# Patient Record
Sex: Female | Born: 1975 | Race: Black or African American | Hispanic: No | Marital: Single | Smoking: Never smoker
Health system: Southern US, Community
[De-identification: ages and names within clinical notes are randomized; demographics above are authoritative.]

## PROBLEM LIST (undated history)

## (undated) DIAGNOSIS — I10 Essential (primary) hypertension: Secondary | ICD-10-CM

## (undated) DIAGNOSIS — J45909 Unspecified asthma, uncomplicated: Secondary | ICD-10-CM

## (undated) DIAGNOSIS — J4489 Other specified chronic obstructive pulmonary disease: Secondary | ICD-10-CM

## (undated) DIAGNOSIS — I63511 Cerebral infarction due to unspecified occlusion or stenosis of right middle cerebral artery: Secondary | ICD-10-CM

## (undated) DIAGNOSIS — F329 Major depressive disorder, single episode, unspecified: Secondary | ICD-10-CM

## (undated) DIAGNOSIS — G40909 Epilepsy, unspecified, not intractable, without status epilepticus: Secondary | ICD-10-CM

## (undated) DIAGNOSIS — E559 Vitamin D deficiency, unspecified: Secondary | ICD-10-CM

## (undated) DIAGNOSIS — J9621 Acute and chronic respiratory failure with hypoxia: Secondary | ICD-10-CM

## (undated) DIAGNOSIS — N390 Urinary tract infection, site not specified: Secondary | ICD-10-CM

## (undated) DIAGNOSIS — J449 Chronic obstructive pulmonary disease, unspecified: Secondary | ICD-10-CM

## (undated) DIAGNOSIS — F32A Depression, unspecified: Secondary | ICD-10-CM

## (undated) DIAGNOSIS — R202 Paresthesia of skin: Secondary | ICD-10-CM

## (undated) DIAGNOSIS — Z930 Tracheostomy status: Secondary | ICD-10-CM

## (undated) DIAGNOSIS — R739 Hyperglycemia, unspecified: Secondary | ICD-10-CM

---

## 2014-05-13 ENCOUNTER — Encounter (HOSPITAL_COMMUNITY): Payer: Self-pay | Admitting: Emergency Medicine

## 2014-05-13 ENCOUNTER — Emergency Department (HOSPITAL_COMMUNITY)
Admission: EM | Admit: 2014-05-13 | Discharge: 2014-05-14 | Disposition: A | Discharge status: Home / Self Care | Payer: Medicare Other | Attending: Emergency Medicine | Admitting: Emergency Medicine

## 2014-05-13 ENCOUNTER — Emergency Department (HOSPITAL_COMMUNITY): Payer: Medicare Other

## 2014-05-13 DIAGNOSIS — R4182 Altered mental status, unspecified: Secondary | ICD-10-CM | POA: Insufficient documentation

## 2014-05-13 DIAGNOSIS — R443 Hallucinations, unspecified: Secondary | ICD-10-CM | POA: Diagnosis present

## 2014-05-13 DIAGNOSIS — N390 Urinary tract infection, site not specified: Secondary | ICD-10-CM | POA: Diagnosis present

## 2014-05-13 DIAGNOSIS — F22 Delusional disorders: Secondary | ICD-10-CM | POA: Diagnosis present

## 2014-05-13 DIAGNOSIS — F3289 Other specified depressive episodes: Secondary | ICD-10-CM | POA: Insufficient documentation

## 2014-05-13 DIAGNOSIS — F329 Major depressive disorder, single episode, unspecified: Secondary | ICD-10-CM | POA: Diagnosis not present

## 2014-05-13 DIAGNOSIS — R209 Unspecified disturbances of skin sensation: Secondary | ICD-10-CM | POA: Diagnosis not present

## 2014-05-13 DIAGNOSIS — J45909 Unspecified asthma, uncomplicated: Secondary | ICD-10-CM | POA: Insufficient documentation

## 2014-05-13 DIAGNOSIS — I10 Essential (primary) hypertension: Secondary | ICD-10-CM | POA: Diagnosis not present

## 2014-05-13 DIAGNOSIS — F29 Unspecified psychosis not due to a substance or known physiological condition: Secondary | ICD-10-CM | POA: Diagnosis present

## 2014-05-13 DIAGNOSIS — E559 Vitamin D deficiency, unspecified: Secondary | ICD-10-CM | POA: Diagnosis not present

## 2014-05-13 DIAGNOSIS — F99 Mental disorder, not otherwise specified: Secondary | ICD-10-CM

## 2014-05-13 DIAGNOSIS — R7309 Other abnormal glucose: Secondary | ICD-10-CM | POA: Insufficient documentation

## 2014-05-13 HISTORY — DX: Vitamin D deficiency, unspecified: E55.9

## 2014-05-13 HISTORY — DX: Major depressive disorder, single episode, unspecified: F32.9

## 2014-05-13 HISTORY — DX: Hyperglycemia, unspecified: R73.9

## 2014-05-13 HISTORY — DX: Unspecified asthma, uncomplicated: J45.909

## 2014-05-13 HISTORY — DX: Paresthesia of skin: R20.2

## 2014-05-13 HISTORY — DX: Depression, unspecified: F32.A

## 2014-05-13 HISTORY — DX: Essential (primary) hypertension: I10

## 2014-05-13 HISTORY — DX: Urinary tract infection, site not specified: N39.0

## 2014-05-13 LAB — URINE MICROSCOPIC-ADD ON

## 2014-05-13 LAB — CBC
HCT: 35.2 % — ABNORMAL LOW (ref 36.0–46.0)
Hemoglobin: 11.5 g/dL — ABNORMAL LOW (ref 12.0–15.0)
MCH: 23 pg — AB (ref 26.0–34.0)
MCHC: 32.7 g/dL (ref 30.0–36.0)
MCV: 70.3 fL — AB (ref 78.0–100.0)
PLATELETS: 311 10*3/uL (ref 150–400)
RBC: 5.01 MIL/uL (ref 3.87–5.11)
RDW: 15.3 % (ref 11.5–15.5)
WBC: 6.7 10*3/uL (ref 4.0–10.5)

## 2014-05-13 LAB — COMPREHENSIVE METABOLIC PANEL
ALBUMIN: 3.6 g/dL (ref 3.5–5.2)
ALT: 13 U/L (ref 0–35)
AST: 13 U/L (ref 0–37)
Alkaline Phosphatase: 129 U/L — ABNORMAL HIGH (ref 39–117)
BUN: 14 mg/dL (ref 6–23)
CALCIUM: 9.4 mg/dL (ref 8.4–10.5)
CHLORIDE: 105 meq/L (ref 96–112)
CO2: 24 meq/L (ref 19–32)
CREATININE: 0.99 mg/dL (ref 0.50–1.10)
GFR calc Af Amer: 83 mL/min — ABNORMAL LOW (ref >90)
GFR, EST NON AFRICAN AMERICAN: 72 mL/min — AB (ref >90)
Glucose, Bld: 78 mg/dL (ref 70–99)
Potassium: 4.3 mEq/L (ref 3.7–5.3)
SODIUM: 141 meq/L (ref 137–147)
Total Bilirubin: 0.6 mg/dL (ref 0.3–1.2)
Total Protein: 7.4 g/dL (ref 6.0–8.3)

## 2014-05-13 LAB — RAPID URINE DRUG SCREEN, HOSP PERFORMED
Amphetamines: NOT DETECTED
BARBITURATES: NOT DETECTED
Benzodiazepines: NOT DETECTED
Cocaine: NOT DETECTED
Opiates: NOT DETECTED
Tetrahydrocannabinol: NOT DETECTED

## 2014-05-13 LAB — URINALYSIS, ROUTINE W REFLEX MICROSCOPIC
BILIRUBIN URINE: NEGATIVE
Glucose, UA: NEGATIVE mg/dL
Hgb urine dipstick: NEGATIVE
Ketones, ur: NEGATIVE mg/dL
NITRITE: NEGATIVE
PROTEIN: NEGATIVE mg/dL
Specific Gravity, Urine: 1.019 (ref 1.005–1.030)
Urobilinogen, UA: 0.2 mg/dL (ref 0.0–1.0)
pH: 5 (ref 5.0–8.0)

## 2014-05-13 LAB — SALICYLATE LEVEL: Salicylate Lvl: <2 mg/dL — ABNORMAL LOW (ref 2.8–20.0)

## 2014-05-13 LAB — POC URINE PREG, ED: Preg Test, Ur: NEGATIVE

## 2014-05-13 LAB — ACETAMINOPHEN LEVEL: Acetaminophen (Tylenol), Serum: <15 ug/mL (ref 10–30)

## 2014-05-13 MED ORDER — ONDANSETRON HCL 4 MG PO TABS: 4.0000 mg | ORAL_TABLET | Freq: Three times a day (TID) | ORAL | Status: DC | PRN

## 2014-05-13 MED ORDER — ZOLPIDEM TARTRATE 5 MG PO TABS: 5.0000 mg | ORAL_TABLET | Freq: Every evening | ORAL | Status: DC | PRN

## 2014-05-13 MED ORDER — LORAZEPAM 1 MG PO TABS: 1.0000 mg | ORAL_TABLET | Freq: Three times a day (TID) | ORAL | Status: DC | PRN

## 2014-05-13 MED ORDER — CEPHALEXIN 250 MG PO CAPS
500.0000 mg | ORAL_CAPSULE | Freq: Once | ORAL | Status: AC
  Administered 2014-05-13: 500 mg via ORAL
  Filled 2014-05-13: qty 2

## 2014-05-13 MED ORDER — ALUM & MAG HYDROXIDE-SIMETH 200-200-20 MG/5ML PO SUSP: 30.0000 mL | ORAL | Status: DC | PRN

## 2014-05-13 MED ORDER — CEPHALEXIN 500 MG PO CAPS
500.0000 mg | ORAL_CAPSULE | Freq: Four times a day (QID) | ORAL | Status: DC
  Administered 2014-05-14 (×3): 500 mg via ORAL
  Filled 2014-05-13 (×3): qty 1

## 2014-05-13 MED ORDER — IBUPROFEN 200 MG PO TABS: 600.0000 mg | ORAL_TABLET | Freq: Three times a day (TID) | ORAL | Status: DC | PRN

## 2014-05-13 MED ORDER — NICOTINE 21 MG/24HR TD PT24: 21.0000 mg | MEDICATED_PATCH | Freq: Every day | TRANSDERMAL | Status: DC

## 2014-05-13 NOTE — ED Notes (Signed)
Pt caregiver, Blondene, called and updated.

## 2014-05-13 NOTE — ED Notes (Signed)
Pt lives in group home. Administrator brought her today for evaluation of change in mental status this week. She was at [facility] yesterday and diagnosed with UTI and started on first dose of oral abx last night. [facility] told the administrator to bring the pt here for further psychiatric care. Pt denies thoughts of hurting herself or others.

## 2014-05-13 NOTE — ED Notes (Signed)
Patient received a ham sandwich

## 2014-05-13 NOTE — ED Notes (Signed)
[phone] Dalene CarrowBlondene Dalton, pt caregiver at group home

## 2014-05-13 NOTE — ED Notes (Signed)
Pt placed into gown upon arrival to room. Pt placed on monitor. Pt monitored by 5 lead, pulse ox, an blood pressure.

## 2014-05-13 NOTE — ED Notes (Signed)
Patient was given a sprite. 

## 2014-05-13 NOTE — ED Notes (Signed)
Patient change into paper scrubs assisted by Rodney LangtonMIchelle, Coker, MHT. Security called to wand patient.

## 2014-05-13 NOTE — ED Provider Notes (Signed)
Medical screening examination/treatment/procedure(s) were conducted as a shared visit with non-physician practitioner(s) and myself.  I personally evaluated the patient during the encounter.  Pt medically stable.  [facility] at [facility].    Linwood DibblesJon Knapp, MD 05/13/14 [phone]

## 2014-05-13 NOTE — ED Notes (Signed)
Pt took the heart monitor off, got dressed and tried to walk out of ED, pt states "You told me I was ready to go, I have to go see my family in [place]." This RN guided pt back to room and informed pt that we were [facility] done. Pt offered something to eat and drink per [facility] approval. nad noted. Pt placed back on heart monitor, currently calm.

## 2014-05-13 NOTE — BH Assessment (Signed)
[facility] Assessment Progress Note  At 15:53 I spoke to EDP Johnnette Gourdobyn Albert, PA-C in anticipation of TTS assessment.  Doylene Canninghomas Hughes, MA Triage Specialist 05/13/2014 @ 15:55

## 2014-05-13 NOTE — ED Notes (Signed)
Patient arrive to unit via ambulatory escorted by [facility] transportation staff fully dressed. Writer explain to patient that she would have to change into paper scrubs patient verbalized understanding.
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
## 2014-05-13 NOTE — ED Notes (Signed)
Contacted BH for update on pt TTS.

## 2014-05-13 NOTE — ED Notes (Signed)
Blondene contacted, states that she [place]. [facility].

## 2014-05-13 NOTE — ED Notes (Signed)
telepsych machine set up at bedside. BH to call in 5 minutes

## 2014-05-14 ENCOUNTER — Encounter (HOSPITAL_COMMUNITY): Payer: Self-pay | Admitting: Psychiatry

## 2014-05-14 DIAGNOSIS — N390 Urinary tract infection, site not specified: Secondary | ICD-10-CM | POA: Diagnosis not present

## 2014-05-14 DIAGNOSIS — F22 Delusional disorders: Secondary | ICD-10-CM | POA: Diagnosis present

## 2014-05-14 DIAGNOSIS — R443 Hallucinations, unspecified: Secondary | ICD-10-CM | POA: Diagnosis present

## 2014-05-14 DIAGNOSIS — F29 Unspecified psychosis not due to a substance or known physiological condition: Secondary | ICD-10-CM | POA: Diagnosis present

## 2014-05-14 LAB — URINE CULTURE
COLONY COUNT: NO GROWTH
CULTURE: NO GROWTH

## 2014-05-14 MED ORDER — ARIPIPRAZOLE 10 MG PO TABS
10.0000 mg | ORAL_TABLET | Freq: Every day | ORAL | Status: DC
  Administered 2014-05-14: 10 mg via ORAL
  Filled 2014-05-14: qty 1

## 2014-05-14 MED ORDER — LISINOPRIL 10 MG PO TABS
10.0000 mg | ORAL_TABLET | Freq: Every day | ORAL | Status: DC
  Administered 2014-05-14: 10 mg via ORAL
  Filled 2014-05-14: qty 1

## 2014-05-14 MED ORDER — CEPHALEXIN 500 MG PO CAPS: 500.0000 mg | ORAL_CAPSULE | Freq: Four times a day (QID) | ORAL | Status: AC

## 2014-05-14 MED ORDER — DULOXETINE HCL 30 MG PO CPEP
30.0000 mg | ORAL_CAPSULE | Freq: Every day | ORAL | Status: DC
  Administered 2014-05-14: 30 mg via ORAL
  Filled 2014-05-14: qty 1
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
## 2014-05-15 NOTE — Consult Note (Signed)
Face to face evaluation and I agree with this note 

## 2014-08-19 ENCOUNTER — Ambulatory Visit (INDEPENDENT_AMBULATORY_CARE_PROVIDER_SITE_OTHER): Payer: Medicare Other | Admitting: Podiatrist

## 2014-08-19 ENCOUNTER — Encounter: Payer: Self-pay | Admitting: Podiatrist

## 2014-08-19 VITALS — BP 124/76 | HR 86 | Resp 12

## 2014-08-19 DIAGNOSIS — M79609 Pain in unspecified limb: Secondary | ICD-10-CM

## 2014-08-19 DIAGNOSIS — B351 Tinea unguium: Secondary | ICD-10-CM | POA: Diagnosis not present

## 2014-08-19 DIAGNOSIS — L84 Corns and callosities: Secondary | ICD-10-CM | POA: Diagnosis not present

## 2014-08-19 DIAGNOSIS — M79676 Pain in unspecified toe(s): Principal | ICD-10-CM

## 2014-08-19 DIAGNOSIS — M216X9 Other acquired deformities of unspecified foot: Secondary | ICD-10-CM | POA: Diagnosis not present

## 2014-08-19 NOTE — Progress Notes (Signed)
  Chief Complaint  Patient presents with  . Nail Problem    ''TOENAILS TRIM AND CHECK DIABETIC FEET.''     HPI: Patient is 38 y.o. female who presents today for thick and elongated toenails. She is not diabetic and has no complaints of numbness or tingling in the feet.  She is unable to trim her toenails herself.     Review of Systems  DATA OBTAINED: from patient's caregiver GENERAL: Feels well no fevers, no fatigue, no changes in appetite SKIN: No itching, no rashes, no open wounds EYES: No eye pain,no redness, no discharge EARS: No earache,no ringing of ears, NOSE: No congestion, no drainage, no bleeding  MOUTH/THROAT: No mouth pain, No sore throat, No difficulty chewing or swallowing  RESPIRATORY: No cough, no wheezing, no SOB CARDIAC: No chest pain,no heart palpitations, GI: No abdominal pain, No Nausea, no vomiting, no diarrhea, no heartburn or no reflux  GU: No dysuria, no increased frequency or urgency MUSCULOSKELETAL: No unrelieved bone/joint pain,  NEUROLOGIC: Awake, alert, appropriate to situation, No change in mental status. PSYCHIATRIC: No overt anxiety or sadness.No behavior issue.      Physical Exam  GENERAL APPEARANCE: Alert, conversant. Appropriately groomed. No acute distress.  VASCULAR: Pedal pulses palpable at 2/4 DP and PT bilateral.  Capillary refill time is immediate to all digits,  Proximal to distal cooling it warm to warm.  Digital hair growth is present bilateral  NEUROLOGIC: sensation is intact epicritically and protectively to 5.07 monofilament at 5/5 sites bilateral.  Light touch is intact bilateral, vibratory sensation intact bilateral, achilles tendon reflex is intact bilateral.  MUSCULOSKELETAL: acceptable muscle strength, tone and stability bilateral.  Intrinsic muscluature intact bilateral.  Rectus appearance of foot and digits noted bilateral.   DERMATOLOGIC: skin color, texture, and turger are within normal limits.  No preulcerative lesions are  seen, no interdigital maceration noted.  No open lesions present.  Digital nails are thick, discolored, distrophic, elongated and fungal    Patient Active Problem List   Diagnosis Date Noted  . Paranoia 05/14/2014  . Hallucinations 05/14/2014  . Psychosis 05/14/2014    Assessment   Symptomatic mycotic toenails  Plan:  Debridement of toenails carried out without complication.   She will be seen back in 3 months or as needed for continued care.

## 2014-11-27 ENCOUNTER — Ambulatory Visit: Payer: Medicaid Other

## 2015-08-12 ENCOUNTER — Ambulatory Visit: Payer: Medicare Other | Admitting: Podiatry

## 2018-10-26 ENCOUNTER — Inpatient Hospital Stay
Admission: RE | Admit: 2018-10-26 | Discharge: 2018-11-23 | Disposition: A | Discharge status: Other Acute Inpatient Hospital | Payer: Medicare Other | Source: Other Acute Inpatient Hospital | Attending: Internal Medicine | Admitting: Internal Medicine

## 2018-10-26 ENCOUNTER — Institutional Professional Consult (permissible substitution) (HOSPITAL_COMMUNITY): Payer: Medicare Other

## 2018-10-26 DIAGNOSIS — G40909 Epilepsy, unspecified, not intractable, without status epilepticus: Secondary | ICD-10-CM

## 2018-10-26 DIAGNOSIS — I63511 Cerebral infarction due to unspecified occlusion or stenosis of right middle cerebral artery: Secondary | ICD-10-CM | POA: Diagnosis present

## 2018-10-26 DIAGNOSIS — Z931 Gastrostomy status: Secondary | ICD-10-CM

## 2018-10-26 DIAGNOSIS — Z930 Tracheostomy status: Secondary | ICD-10-CM

## 2018-10-26 DIAGNOSIS — J449 Chronic obstructive pulmonary disease, unspecified: Secondary | ICD-10-CM | POA: Diagnosis present

## 2018-10-26 DIAGNOSIS — J9621 Acute and chronic respiratory failure with hypoxia: Secondary | ICD-10-CM | POA: Diagnosis present

## 2018-10-26 DIAGNOSIS — J189 Pneumonia, unspecified organism: Secondary | ICD-10-CM

## 2018-10-26 HISTORY — DX: Chronic obstructive pulmonary disease, unspecified: J44.9

## 2018-10-26 HISTORY — DX: Tracheostomy status: Z93.0

## 2018-10-26 HISTORY — DX: Epilepsy, unspecified, not intractable, without status epilepticus: G40.909

## 2018-10-26 HISTORY — DX: Other specified chronic obstructive pulmonary disease: J44.89

## 2018-10-26 HISTORY — DX: Cerebral infarction due to unspecified occlusion or stenosis of right middle cerebral artery: I63.511

## 2018-10-26 HISTORY — DX: Acute and chronic respiratory failure with hypoxia: J96.21

## 2018-10-26 LAB — BLOOD GAS, ARTERIAL
Acid-Base Excess: 2.3 mmol/L — ABNORMAL HIGH (ref 0.0–2.0)
Bicarbonate: 26.4 mmol/L (ref 20.0–28.0)
FIO2: 0.3
LHR: 14 {breaths}/min
O2 SAT: 96.8 %
PATIENT TEMPERATURE: 98.6
PCO2 ART: 41.3 mmHg (ref 32.0–48.0)
PEEP: 30 cmH2O
PH ART: 7.421 (ref 7.350–7.450)
PIP: 16 cmH2O
PO2 ART: 86.7 mmHg (ref 83.0–108.0)

## 2018-10-26 MED ORDER — IOPAMIDOL (ISOVUE-300) INJECTION 61%
50.0000 mL | Freq: Once | INTRAVENOUS | Status: AC | PRN
  Administered 2018-11-18: 75 mL

## 2018-10-26 MED ORDER — IOPAMIDOL (ISOVUE-300) INJECTION 61%
INTRAVENOUS | Status: AC
  Administered 2018-10-26: 50 mL via GASTROSTOMY
  Filled 2018-10-26: qty 50

## 2018-10-27 DIAGNOSIS — J449 Chronic obstructive pulmonary disease, unspecified: Secondary | ICD-10-CM | POA: Diagnosis not present

## 2018-10-27 DIAGNOSIS — I63511 Cerebral infarction due to unspecified occlusion or stenosis of right middle cerebral artery: Secondary | ICD-10-CM | POA: Diagnosis not present

## 2018-10-27 DIAGNOSIS — J9621 Acute and chronic respiratory failure with hypoxia: Secondary | ICD-10-CM

## 2018-10-27 DIAGNOSIS — Z930 Tracheostomy status: Secondary | ICD-10-CM

## 2018-10-27 DIAGNOSIS — G40909 Epilepsy, unspecified, not intractable, without status epilepticus: Secondary | ICD-10-CM

## 2018-10-27 LAB — CBC
HEMATOCRIT: 32.4 % — AB (ref 36.0–46.0)
HEMOGLOBIN: 10 g/dL — AB (ref 12.0–15.0)
MCH: 23.1 pg — AB (ref 26.0–34.0)
MCHC: 30.9 g/dL (ref 30.0–36.0)
MCV: 74.8 fL — ABNORMAL LOW (ref 80.0–100.0)
Platelets: 262 10*3/uL (ref 150–400)
RBC: 4.33 MIL/uL (ref 3.87–5.11)
RDW: 15.4 % (ref 11.5–15.5)
WBC: 10 10*3/uL (ref 4.0–10.5)
nRBC: 0.2 % (ref 0.0–0.2)

## 2018-10-27 LAB — COMPREHENSIVE METABOLIC PANEL
ALBUMIN: 2.4 g/dL — AB (ref 3.5–5.0)
ALT: 19 U/L (ref 0–44)
AST: 27 U/L (ref 15–41)
Alkaline Phosphatase: 96 U/L (ref 38–126)
Anion gap: 13 (ref 5–15)
BUN: 18 mg/dL (ref 6–20)
CHLORIDE: 108 mmol/L (ref 98–111)
CO2: 24 mmol/L (ref 22–32)
Calcium: 9 mg/dL (ref 8.9–10.3)
Creatinine, Ser: 0.97 mg/dL (ref 0.44–1.00)
GFR calc Af Amer: >60 mL/min (ref >60)
GFR calc non Af Amer: >60 mL/min (ref >60)
Glucose, Bld: 145 mg/dL — ABNORMAL HIGH (ref 70–99)
POTASSIUM: 3.8 mmol/L (ref 3.5–5.1)
SODIUM: 145 mmol/L (ref 135–145)
Total Bilirubin: 0.4 mg/dL (ref 0.3–1.2)
Total Protein: 6.7 g/dL (ref 6.5–8.1)

## 2018-10-27 LAB — C DIFFICILE QUICK SCREEN W PCR REFLEX
C Diff antigen: NEGATIVE
C Diff interpretation: NOT DETECTED
C Diff toxin: NEGATIVE

## 2018-10-27 NOTE — Consult Note (Signed)
[facility]  Date of Service: 10/27/2018  PULMONARY CRITICAL CARE CONSULT   Vanessa HamperChrystal West  MVH:[id number]:[id number]  DOB: [birth year]   DOA: 10/26/2018  Referring Physician: Carron CurieAli Hijazi, MD  HPI: Vanessa HamperChrystal West is a 42 y.o. female seen for follow up of Acute on Chronic Respiratory Failure.  Patient apparently had been transferred to the acute care facility with history of seizures.  Apparently had not been taking her medications for approximately 1 week.  CT scan was done for evaluation showed a middle cerebral artery stroke pattern with cerebral edema and midline shift.  Patient at that time was admitted to the ICU to be intubated because of respiratory airway protection.  She is currently on the ventilator at this time.  Hospital course basically she was not able to come off of the ventilator.  She required prolonged mechanical ventilation and for this reason patient underwent a tracheostomy.  She had been found to have an acute stroke with edema compression of her brain.  Review of Systems:  ROS performed and is unremarkable other than noted above.  Past Medical History:  Diagnosis Date  . Asthma   . Depression   . Hyperglycemia   . Hypertension   . Paresthesia   . Urinary tract infection 05/13/2014  . Vitamin D deficiency     No past surgical history on file.  Social History:    reports that she has never smoked. She has never used smokeless tobacco. She reports that she does not drink alcohol or use drugs.  Family History: Non-Contributory to the present illness  Allergies  Allergen Reactions  . Wellbutrin [Bupropion]     Medications: Reviewed on Rounds  Physical Exam:  Vitals: Temperature 97.6 pulse 88 respiratory rate 17 blood pressure 166/87 saturations 96%  Ventilator Settings mode of ventilation assist control FiO2 30% PEEP 5  . General: Comfortable at this time . Eyes: Grossly normal lids,  irises & conjunctiva . ENT: grossly tongue is normal . Neck: no obvious mass . Cardiovascular: S1-S2 normal no gallop or rub . Respiratory: Coarse rhonchi expansion equal . Abdomen: Obese soft nontender . Skin: no rash seen on limited exam . Musculoskeletal: not rigid . Psychiatric:unable to assess . Neurologic: no seizure no involuntary movements         Labs on Admission:  Basic Metabolic Panel: Recent Labs  Lab 10/27/18 0538  NA 145  K 3.8  CL 108  CO2 24  GLUCOSE 145*  BUN 18  CREATININE 0.97  CALCIUM 9.0    Recent Labs  Lab 10/26/18 2205  PHART 7.421  PCO2ART 41.3  PO2ART 86.7  HCO3 26.4  O2SAT 96.8    Liver Function Tests: Recent Labs  Lab 10/27/18 0538  AST 27  ALT 19  ALKPHOS 96  BILITOT 0.4  PROT 6.7  ALBUMIN 2.4*   No results for input(s): LIPASE, AMYLASE in the last 168 hours. No results for input(s): AMMONIA in the last 168 hours.  CBC: Recent Labs  Lab 10/27/18 0538  WBC 10.0  HGB 10.0*  HCT 32.4*  MCV 74.8*  PLT 262    Cardiac Enzymes: No results for input(s): CKTOTAL, CKMB, CKMBINDEX, TROPONINI in the last 168 hours.  BNP (last 3 results) No results for input(s): BNP in the last 8760 hours.  ProBNP (last 3 results) No results for input(s): PROBNP in the last 8760 hours.   Radiological Exams on Admission: Dg Abdomen Peg Tube Location  Result Date: 10/26/2018  CLINICAL DATA:  Peg tube status EXAM: ABDOMEN - 1 VIEW COMPARISON:  CT 04/25/2015 FINDINGS: Hand injection of contrast through the percutaneous gastrostomy tube demonstrates contrast filling the gastric fundus and gastric body. Retention bulb appears to be towards the gastric antrum. IMPRESSION: Contrast fills the stomach consistent with appropriate positioning of the percutaneous gastrostomy tube. Electronically Signed   By: Genevive Bi M.D.   On: 10/26/2018 23:53    Assessment/Plan Active Problems:   Acute on chronic respiratory failure with hypoxia (HCC)    Acute right arterial ischemic stroke, middle cerebral artery (MCA) (HCC)   Seizure disorder (HCC)   Chronic obstructive asthma without status asthmaticus (HCC)   Tracheostomy status (HCC)   1. Acute on chronic respiratory failure with hypoxia Case discussed on rounds with respiratory therapy.  We will continue with full support at this time titrate oxygen continue pulmonary toilet secretion management.  No chest x-ray was done will need 2. Acute stroke involving the middle cerebral artery territory.  We will continue with supportive care therapy she right now is nonverbal. 3. Seizure disorder she had an EEG done which had shown abnormal EEG with periodic discharges on the right and slowing of the background activity.  We will continue to monitor closely. 4. Chronic obstructive asthma at this time she is controlled we will use nebulizers as needed 5. Tracheostomy will continue with supportive care  I have personally seen and evaluated the patient, evaluated laboratory and imaging results, formulated the assessment and plan and placed orders. The Patient requires high complexity decision making for assessment and support.  Case was discussed on Rounds with the Respiratory Therapy Staff Time Spent  Yevonne Pax, MD [facility] Pulmonary Critical Care Medicine Sleep Medicine

## 2018-10-28 ENCOUNTER — Other Ambulatory Visit (HOSPITAL_COMMUNITY): Payer: Medicare Other

## 2018-10-28 ENCOUNTER — Encounter: Payer: Self-pay | Admitting: Internal Medicine

## 2018-10-28 DIAGNOSIS — J9621 Acute and chronic respiratory failure with hypoxia: Secondary | ICD-10-CM | POA: Diagnosis present

## 2018-10-28 DIAGNOSIS — J181 Lobar pneumonia, unspecified organism: Secondary | ICD-10-CM | POA: Diagnosis not present

## 2018-10-28 DIAGNOSIS — J449 Chronic obstructive pulmonary disease, unspecified: Secondary | ICD-10-CM | POA: Diagnosis not present

## 2018-10-28 DIAGNOSIS — Z930 Tracheostomy status: Secondary | ICD-10-CM

## 2018-10-28 DIAGNOSIS — I63511 Cerebral infarction due to unspecified occlusion or stenosis of right middle cerebral artery: Secondary | ICD-10-CM | POA: Diagnosis present

## 2018-10-28 DIAGNOSIS — G40909 Epilepsy, unspecified, not intractable, without status epilepticus: Secondary | ICD-10-CM

## 2018-10-28 LAB — URINALYSIS, ROUTINE W REFLEX MICROSCOPIC
Bacteria, UA: NONE SEEN
Bilirubin Urine: NEGATIVE
Glucose, UA: NEGATIVE mg/dL
Ketones, ur: NEGATIVE mg/dL
Nitrite: NEGATIVE
Protein, ur: NEGATIVE mg/dL
RBC / HPF: >50 RBC/hpf — ABNORMAL HIGH (ref 0–5)
Specific Gravity, Urine: 1.021 (ref 1.005–1.030)
pH: 5 (ref 5.0–8.0)

## 2018-10-28 MED ORDER — HYDRALAZINE HCL 20 MG/ML IJ SOLN
10.00 | INTRAMUSCULAR | Status: DC
Start: ? — End: 2018-10-28

## 2018-10-28 MED ORDER — LABETALOL HCL 5 MG/ML IV SOLN
10.00 | INTRAVENOUS | Status: DC
Start: ? — End: 2018-10-28

## 2018-10-28 MED ORDER — HYDROCORTISONE NA SUCCINATE PF 100 MG IJ SOLR
25.00 | INTRAMUSCULAR | Status: DC
Start: 2018-10-27 — End: 2018-10-28

## 2018-10-28 MED ORDER — SODIUM CHLORIDE 0.9 % IV SOLN
10.00 | INTRAVENOUS | Status: DC
Start: ? — End: 2018-10-28

## 2018-10-28 MED ORDER — LATANOPROST 0.005 % OP SOLN
1.00 | OPHTHALMIC | Status: DC
Start: 2018-10-26 — End: 2018-10-28

## 2018-10-28 MED ORDER — BISACODYL 10 MG RE SUPP
10.00 | RECTAL | Status: DC
Start: ? — End: 2018-10-28

## 2018-10-28 MED ORDER — CHLORHEXIDINE GLUCONATE 0.12 % MT SOLN
15.00 | OROMUCOSAL | Status: DC
Start: 2018-10-26 — End: 2018-10-28

## 2018-10-28 MED ORDER — ACETAMINOPHEN 325 MG PO TABS
650.00 | ORAL_TABLET | ORAL | Status: DC
Start: ? — End: 2018-10-28

## 2018-10-28 MED ORDER — LAMOTRIGINE 100 MG PO TABS
100.00 | ORAL_TABLET | ORAL | Status: DC
Start: 2018-10-26 — End: 2018-10-28

## 2018-10-28 MED ORDER — POTASSIUM CHLORIDE 20 MEQ/15ML (10%) PO SOLN
20.00 | ORAL | Status: DC
Start: ? — End: 2018-10-28

## 2018-10-28 MED ORDER — GENERIC EXTERNAL MEDICATION
3.38 | Status: DC
Start: 2018-10-27 — End: 2018-10-28

## 2018-10-28 MED ORDER — ASPIRIN 325 MG PO TABS
325.00 | ORAL_TABLET | ORAL | Status: DC
Start: 2018-10-27 — End: 2018-10-28

## 2018-10-28 MED ORDER — OXYCODONE HCL 5 MG PO TABS
5.00 | ORAL_TABLET | ORAL | Status: DC
Start: ? — End: 2018-10-28

## 2018-10-28 MED ORDER — DORZOLAMIDE HCL-TIMOLOL MAL 22.3-6.8 MG/ML OP SOLN
1.00 | OPHTHALMIC | Status: DC
Start: 2018-10-26 — End: 2018-10-28

## 2018-10-28 MED ORDER — DOCUSATE SODIUM 150 MG/15ML PO LIQD
100.00 | ORAL | Status: DC
Start: 2018-10-26 — End: 2018-10-28

## 2018-10-28 MED ORDER — LEVETIRACETAM 500 MG PO TABS
1500.00 | ORAL_TABLET | ORAL | Status: DC
Start: 2018-10-26 — End: 2018-10-28

## 2018-10-28 MED ORDER — GENERIC EXTERNAL MEDICATION
10.00 | Status: DC
Start: ? — End: 2018-10-28

## 2018-10-28 MED ORDER — ALBUTEROL SULFATE 0.63 MG/3ML IN NEBU
1.00 | INHALATION_SOLUTION | RESPIRATORY_TRACT | Status: DC
Start: ? — End: 2018-10-28

## 2018-10-28 MED ORDER — SENNOSIDES 8.8 MG/5ML PO SYRP
5.00 | ORAL_SOLUTION | ORAL | Status: DC
Start: 2018-10-26 — End: 2018-10-28

## 2018-10-28 MED ORDER — VALPROIC ACID 250 MG/5ML PO SOLN
750.00 | ORAL | Status: DC
Start: 2018-10-27 — End: 2018-10-28

## 2018-10-28 MED ORDER — HEPARIN SODIUM (PORCINE) 5000 UNIT/ML IJ SOLN
5000.00 | INTRAMUSCULAR | Status: DC
Start: 2018-10-26 — End: 2018-10-28

## 2018-10-28 MED ORDER — ATORVASTATIN CALCIUM 40 MG PO TABS
40.00 | ORAL_TABLET | ORAL | Status: DC
Start: 2018-10-26 — End: 2018-10-28

## 2018-10-28 MED ORDER — PURALUBE 85-15 % OP OINT
TOPICAL_OINTMENT | OPHTHALMIC | Status: DC
Start: 2018-10-26 — End: 2018-10-28

## 2018-10-28 MED ORDER — POTASSIUM CHLORIDE CRYS ER 20 MEQ PO TBCR
20.00 | EXTENDED_RELEASE_TABLET | ORAL | Status: DC
Start: ? — End: 2018-10-28

## 2018-10-28 MED ORDER — POTASSIUM CHLORIDE 20 MEQ/50ML IV SOLN
20.00 | INTRAVENOUS | Status: DC
Start: ? — End: 2018-10-28

## 2018-10-28 MED ORDER — ONDANSETRON HCL 4 MG/2ML IJ SOLN
4.00 | INTRAMUSCULAR | Status: DC
Start: ? — End: 2018-10-28

## 2018-10-28 MED ORDER — LACOSAMIDE 200 MG PO TABS
200.00 | ORAL_TABLET | ORAL | Status: DC
Start: 2018-10-26 — End: 2018-10-28

## 2018-10-28 NOTE — Progress Notes (Signed)
[facility]   PULMONARY CRITICAL CARE SERVICE  PROGRESS NOTE  Date of Service: 10/28/2018  Vanessa West  YQM:[id number]  DOB: [birth year]   DOA: 10/26/2018  Referring Physician: Carron CurieAli Hijazi, MD  HPI: Vanessa HamperChrystal Borg is a 42 y.o. female seen for follow up of Acute on Chronic Respiratory Failure.  Patient remains on full support at this time.  She did manage 3 hours yesterday on pressure support 12 PEEP of 5.  Early this morning the patient had a low-grade temp and became tachycardic in the 120s and 130s.  Will get chest x-ray at this time.  Medications: Reviewed on Rounds  Physical Exam:  Vitals: Pulse 107 respirations 18 blood pressure 178/92 O2 saturation 97% temp 99.1.  Ventilator Settings ventilator mode AC PC FiO2 30% tidal volume 492 PEEP of 5.  . General: Comfortable at this time . Eyes: Grossly normal lids, irises & conjunctiva . ENT: grossly tongue is normal . Neck: no obvious mass . Cardiovascular: S1 S2 normal no gallop . Respiratory: Coarse breath sounds bilaterally . Abdomen: soft . Skin: no rash seen on limited exam . Musculoskeletal: not rigid . Psychiatric:unable to assess . Neurologic: no seizure no involuntary movements         Lab Data:   Basic Metabolic Panel: Recent Labs  Lab 10/27/18 0538  NA 145  K 3.8  CL 108  CO2 24  GLUCOSE 145*  BUN 18  CREATININE 0.97  CALCIUM 9.0    ABG: Recent Labs  Lab 10/26/18 2205  PHART 7.421  PCO2ART 41.3  PO2ART 86.7  HCO3 26.4  O2SAT 96.8    Liver Function Tests: Recent Labs  Lab 10/27/18 0538  AST 27  ALT 19  ALKPHOS 96  BILITOT 0.4  PROT 6.7  ALBUMIN 2.4*   No results for input(s): LIPASE, AMYLASE in the last 168 hours. No results for input(s): AMMONIA in the last 168 hours.  CBC: Recent Labs  Lab 10/27/18 0538  WBC 10.0  HGB 10.0*  HCT 32.4*  MCV 74.8*  PLT 262    Cardiac Enzymes: No results for input(s): CKTOTAL, CKMB,  CKMBINDEX, TROPONINI in the last 168 hours.  BNP (last 3 results) No results for input(s): BNP in the last 8760 hours.  ProBNP (last 3 results) No results for input(s): PROBNP in the last 8760 hours.  Radiological Exams: Dg Abdomen Peg Tube Location  Result Date: 10/26/2018 CLINICAL DATA:  Peg tube status EXAM: ABDOMEN - 1 VIEW COMPARISON:  CT 04/25/2015 FINDINGS: Hand injection of contrast through the percutaneous gastrostomy tube demonstrates contrast filling the gastric fundus and gastric body. Retention bulb appears to be towards the gastric antrum. IMPRESSION: Contrast fills the stomach consistent with appropriate positioning of the percutaneous gastrostomy tube. Electronically Signed   By: Genevive BiStewart  Edmunds M.D.   On: 10/26/2018 23:53    Assessment/Plan Active Problems:   Acute on chronic respiratory failure with hypoxia (HCC)   Acute right arterial ischemic stroke, middle cerebral artery (MCA) (HCC)   Seizure disorder (HCC)   Chronic obstructive asthma without status asthmaticus (HCC)   Tracheostomy status (HCC)   1. Acute on chronic respiratory failure with hypoxia due to patient's episodes of fever and tachycardia will obtain chest x-ray at this time.  Consider antibiotic initiation based on chest x-ray.  Also patient has significant atelectasis needs aggressive pulmonary toilet. 2. Acute right middle cerebral artery stroke.  We will continue with supportive care she remains nonverbal nonresponsive. 3. Seizure disorder no  active seizures are visualized at this time.  EEG results of already been reviewed from previous admission. 4. Chronic obstructive asthma right now is stable we will continue with supportive care. 5. Tracheostomy remains in place continue aggressive pulmonary toilet. 6. Atelectasis lower lobes possible pneumonitis will discuss with the primary care team   I have personally seen and evaluated the patient, evaluated laboratory and imaging results, formulated the  assessment and plan and placed orders.  Time 35 minutes review of chart discussion with primary case care team The Patient requires high complexity decision making for assessment and support.  Case was discussed on Rounds with the Respiratory Therapy Staff  Yevonne Pax, MD [facility] Pulmonary Critical Care Medicine Sleep Medicine

## 2018-10-29 DIAGNOSIS — J9621 Acute and chronic respiratory failure with hypoxia: Secondary | ICD-10-CM | POA: Diagnosis not present

## 2018-10-29 DIAGNOSIS — G40909 Epilepsy, unspecified, not intractable, without status epilepticus: Secondary | ICD-10-CM | POA: Diagnosis not present

## 2018-10-29 DIAGNOSIS — I63511 Cerebral infarction due to unspecified occlusion or stenosis of right middle cerebral artery: Secondary | ICD-10-CM | POA: Diagnosis not present

## 2018-10-29 DIAGNOSIS — J449 Chronic obstructive pulmonary disease, unspecified: Secondary | ICD-10-CM | POA: Diagnosis not present

## 2018-10-29 LAB — CBC
HEMATOCRIT: 31.7 % — AB (ref 36.0–46.0)
Hemoglobin: 9.3 g/dL — ABNORMAL LOW (ref 12.0–15.0)
MCH: 22.4 pg — ABNORMAL LOW (ref 26.0–34.0)
MCHC: 29.3 g/dL — ABNORMAL LOW (ref 30.0–36.0)
MCV: 76.2 fL — ABNORMAL LOW (ref 80.0–100.0)
NRBC: 0.3 % — AB (ref 0.0–0.2)
Platelets: 292 10*3/uL (ref 150–400)
RBC: 4.16 MIL/uL (ref 3.87–5.11)
RDW: 15.8 % — ABNORMAL HIGH (ref 11.5–15.5)
WBC: 11.7 10*3/uL — ABNORMAL HIGH (ref 4.0–10.5)

## 2018-10-29 LAB — URINE CULTURE: Culture: NO GROWTH

## 2018-10-29 LAB — BASIC METABOLIC PANEL
Anion gap: 11 (ref 5–15)
BUN: 17 mg/dL (ref 6–20)
CHLORIDE: 106 mmol/L (ref 98–111)
CO2: 29 mmol/L (ref 22–32)
Calcium: 8.9 mg/dL (ref 8.9–10.3)
Creatinine, Ser: 0.87 mg/dL (ref 0.44–1.00)
GFR calc Af Amer: >60 mL/min (ref >60)
GFR calc non Af Amer: >60 mL/min (ref >60)
Glucose, Bld: 113 mg/dL — ABNORMAL HIGH (ref 70–99)
POTASSIUM: 4.1 mmol/L (ref 3.5–5.1)
Sodium: 146 mmol/L — ABNORMAL HIGH (ref 135–145)

## 2018-10-29 NOTE — Progress Notes (Signed)
[facility]   PULMONARY CRITICAL CARE SERVICE  PROGRESS NOTE  Date of Service: 10/29/2018  Vanessa West  AVW:[id number]  DOB: 11/04/1976   DOA: 10/26/2018  Referring Physician: Carron CurieAli Hijazi, MD  HPI: Vanessa West is a 42 y.o. female seen for follow up of Acute on Chronic Respiratory Failure.  Patient is on pressure support line at this time is on 30% oxygen good saturations are noted  Medications: Reviewed on Rounds  Physical Exam:  Vitals: Temperature 99.8 pulse 109 respiratory rate 18 blood pressure 143/76 saturations 96%  Ventilator Settings mode of ventilation pressure support FiO2 30% tidal volume 590 pressure support 12 PEEP 5  . General: Comfortable at this time . Eyes: Grossly normal lids, irises & conjunctiva . ENT: grossly tongue is normal . Neck: no obvious mass . Cardiovascular: S1 S2 normal no gallop . Respiratory: Scattered rhonchi expansion is equal . Abdomen: soft . Skin: no rash seen on limited exam . Musculoskeletal: not rigid . Psychiatric:unable to assess . Neurologic: no seizure no involuntary movements         Lab Data:   Basic Metabolic Panel: Recent Labs  Lab 10/27/18 0538 10/29/18 0600  NA 145 146*  K 3.8 4.1  CL 108 106  CO2 24 29  GLUCOSE 145* 113*  BUN 18 17  CREATININE 0.97 0.87  CALCIUM 9.0 8.9    ABG: Recent Labs  Lab 10/26/18 2205  PHART 7.421  PCO2ART 41.3  PO2ART 86.7  HCO3 26.4  O2SAT 96.8    Liver Function Tests: Recent Labs  Lab 10/27/18 0538  AST 27  ALT 19  ALKPHOS 96  BILITOT 0.4  PROT 6.7  ALBUMIN 2.4*   No results for input(s): LIPASE, AMYLASE in the last 168 hours. No results for input(s): AMMONIA in the last 168 hours.  CBC: Recent Labs  Lab 10/27/18 0538 10/29/18 0600  WBC 10.0 11.7*  HGB 10.0* 9.3*  HCT 32.4* 31.7*  MCV 74.8* 76.2*  PLT 262 292    Cardiac Enzymes: No results for input(s): CKTOTAL, CKMB, CKMBINDEX, TROPONINI in  the last 168 hours.  BNP (last 3 results) No results for input(s): BNP in the last 8760 hours.  ProBNP (last 3 results) No results for input(s): PROBNP in the last 8760 hours.  Radiological Exams: Dg Chest Port 1 View  Result Date: 10/28/2018 CLINICAL DATA:  Fever. Tracheostomy. EXAM: PORTABLE CHEST 1 VIEW COMPARISON:  Or 04/09/2010 FINDINGS: Tracheostomy grossly well positioned. Upper lungs are clear. Artifact overlies the chest. Patchy density at both lung bases could be atelectasis or mild basilar pneumonia. No dense consolidation or lobar collapse. No sign of effusion. Right arm PICC tip in the SVC above the right atrium. IMPRESSION: Patchy density at both lung bases that could be atelectasis or mild basilar pneumonia. Electronically Signed   By: Paulina FusiMark  Shogry M.D.   On: 10/28/2018 11:28    Assessment/Plan Active Problems:   Acute on chronic respiratory failure with hypoxia (HCC)   Acute right arterial ischemic stroke, middle cerebral artery (MCA) (HCC)   Seizure disorder (HCC)   Chronic obstructive asthma without status asthmaticus (HCC)   Tracheostomy status (HCC)   1. Acute on chronic respiratory failure with hypoxia we will continue with pressure support mode continue pulmonary toilet supportive care so far seems to be tolerating pressure support fairly well. 2. Acute ischemic stroke unchanged continue with supportive care 3. Seizure disorder no active seizures are noted. 4. COPD stable at this time 5.  Tracheostomy status stable we will continue to monitor   I have personally seen and evaluated the patient, evaluated laboratory and imaging results, formulated the assessment and plan and placed orders. The Patient requires high complexity decision making for assessment and support.  Case was discussed on Rounds with the Respiratory Therapy Staff  Yevonne Pax, MD [facility] Pulmonary Critical Care Medicine Sleep Medicine

## 2018-10-30 DIAGNOSIS — J9621 Acute and chronic respiratory failure with hypoxia: Secondary | ICD-10-CM | POA: Diagnosis not present

## 2018-10-30 DIAGNOSIS — J449 Chronic obstructive pulmonary disease, unspecified: Secondary | ICD-10-CM | POA: Diagnosis not present

## 2018-10-30 DIAGNOSIS — G40909 Epilepsy, unspecified, not intractable, without status epilepticus: Secondary | ICD-10-CM | POA: Diagnosis not present

## 2018-10-30 DIAGNOSIS — I63511 Cerebral infarction due to unspecified occlusion or stenosis of right middle cerebral artery: Secondary | ICD-10-CM | POA: Diagnosis not present

## 2018-10-30 LAB — MAGNESIUM: Magnesium: 2.3 mg/dL (ref 1.7–2.4)

## 2018-10-30 NOTE — Progress Notes (Signed)
[facility]   PULMONARY CRITICAL CARE SERVICE  PROGRESS NOTE  Date of Service: 10/30/2018  Blaine HamperChrystal Foulk  WUJ:[id number]:[id number]  DOB: 10-Feb-1976   DOA: 10/26/2018  Referring Physician: Carron CurieAli Hijazi, MD  HPI: Blaine HamperChrystal Bonini is a 42 y.o. female seen for follow up of Acute on Chronic Respiratory Failure.  Comfortable no distress at this time patient is weaning on pressure support mode the goal is for 16 hours today  Medications: Reviewed on Rounds  Physical Exam:  Vitals: Temperature 99.7 pulse 118 respiratory rate 17 blood pressure 157/84 saturations 99%  Ventilator Settings mode of ventilation pressure support FiO2 28% tidal volume 470 pressure support 12 PEEP 5  . General: Comfortable at this time . Eyes: Grossly normal lids, irises & conjunctiva . ENT: grossly tongue is normal . Neck: no obvious mass . Cardiovascular: S1 S2 normal no gallop . Respiratory: No rhonchi or rales are noted . Abdomen: soft . Skin: no rash seen on limited exam . Musculoskeletal: not rigid . Psychiatric:unable to assess . Neurologic: no seizure no involuntary movements         Lab Data:   Basic Metabolic Panel: Recent Labs  Lab 10/27/18 0538 10/29/18 0600  NA 145 146*  K 3.8 4.1  CL 108 106  CO2 24 29  GLUCOSE 145* 113*  BUN 18 17  CREATININE 0.97 0.87  CALCIUM 9.0 8.9    ABG: Recent Labs  Lab 10/26/18 2205  PHART 7.421  PCO2ART 41.3  PO2ART 86.7  HCO3 26.4  O2SAT 96.8    Liver Function Tests: Recent Labs  Lab 10/27/18 0538  AST 27  ALT 19  ALKPHOS 96  BILITOT 0.4  PROT 6.7  ALBUMIN 2.4*   No results for input(s): LIPASE, AMYLASE in the last 168 hours. No results for input(s): AMMONIA in the last 168 hours.  CBC: Recent Labs  Lab 10/27/18 0538 10/29/18 0600  WBC 10.0 11.7*  HGB 10.0* 9.3*  HCT 32.4* 31.7*  MCV 74.8* 76.2*  PLT 262 292    Cardiac Enzymes: No results for input(s): CKTOTAL, CKMB, CKMBINDEX,  TROPONINI in the last 168 hours.  BNP (last 3 results) No results for input(s): BNP in the last 8760 hours.  ProBNP (last 3 results) No results for input(s): PROBNP in the last 8760 hours.  Radiological Exams: No results found.  Assessment/Plan Active Problems:   Acute on chronic respiratory failure with hypoxia (HCC)   Acute right arterial ischemic stroke, middle cerebral artery (MCA) (HCC)   Seizure disorder (HCC)   Chronic obstructive asthma without status asthmaticus (HCC)   Tracheostomy status (HCC)   1. Acute on chronic respiratory failure with hypoxia we will continue with weaning on pressure support on protocol.  Goal is 16 hours. 2. Acute right ischemic stroke continue with supportive care therapy as tolerated 3. Seizure disorder respiratory therapy reports that she was moving her extremities today. 4. CO PD chronic asthma continue with supportive care 5. Tracheostomy remains in place   I have personally seen and evaluated the patient, evaluated laboratory and imaging results, formulated the assessment and plan and placed orders. The Patient requires high complexity decision making for assessment and support.  Case was discussed on Rounds with the Respiratory Therapy Staff  Yevonne PaxSaadat A Xavi Tomasik, MD [facility] Pulmonary Critical Care Medicine Sleep Medicine

## 2018-10-31 DIAGNOSIS — J449 Chronic obstructive pulmonary disease, unspecified: Secondary | ICD-10-CM | POA: Diagnosis not present

## 2018-10-31 DIAGNOSIS — G40909 Epilepsy, unspecified, not intractable, without status epilepticus: Secondary | ICD-10-CM | POA: Diagnosis not present

## 2018-10-31 DIAGNOSIS — J9621 Acute and chronic respiratory failure with hypoxia: Secondary | ICD-10-CM | POA: Diagnosis not present

## 2018-10-31 DIAGNOSIS — I63511 Cerebral infarction due to unspecified occlusion or stenosis of right middle cerebral artery: Secondary | ICD-10-CM | POA: Diagnosis not present

## 2018-10-31 LAB — CBC
HCT: 32.6 % — ABNORMAL LOW (ref 36.0–46.0)
HEMOGLOBIN: 9.6 g/dL — AB (ref 12.0–15.0)
MCH: 22.8 pg — ABNORMAL LOW (ref 26.0–34.0)
MCHC: 29.4 g/dL — AB (ref 30.0–36.0)
MCV: 77.4 fL — ABNORMAL LOW (ref 80.0–100.0)
Platelets: 309 10*3/uL (ref 150–400)
RBC: 4.21 MIL/uL (ref 3.87–5.11)
RDW: 17.3 % — ABNORMAL HIGH (ref 11.5–15.5)
WBC: 13.2 10*3/uL — AB (ref 4.0–10.5)
nRBC: 0.2 % (ref 0.0–0.2)

## 2018-10-31 LAB — BASIC METABOLIC PANEL
Anion gap: 11 (ref 5–15)
BUN: 18 mg/dL (ref 6–20)
CO2: 26 mmol/L (ref 22–32)
Calcium: 9 mg/dL (ref 8.9–10.3)
Chloride: 109 mmol/L (ref 98–111)
Creatinine, Ser: 0.87 mg/dL (ref 0.44–1.00)
GFR calc non Af Amer: >60 mL/min (ref >60)
Glucose, Bld: 150 mg/dL — ABNORMAL HIGH (ref 70–99)
Potassium: 4.2 mmol/L (ref 3.5–5.1)
Sodium: 146 mmol/L — ABNORMAL HIGH (ref 135–145)

## 2018-10-31 LAB — CULTURE, RESPIRATORY W GRAM STAIN

## 2018-10-31 LAB — MAGNESIUM: Magnesium: 2.3 mg/dL (ref 1.7–2.4)

## 2018-10-31 NOTE — Progress Notes (Signed)
P[facility]   PULMONARY CRITICAL CARE SERVICE  PROGRESS NOTE  Date of Service: 10/31/2018  Vanessa West  ZOX:[id number]  DOB: 19-Nov-1976   DOA: 10/26/2018  Referring Physician: Carron CurieAli Hijazi, MD  HPI: Vanessa HamperChrystal Copland is a 42 y.o. female seen for follow up of Acute on Chronic Respiratory Failure.  Patient is on T collar for about 4 hours.  She actually did well.  She will be resting back on pressure support mode  Medications: Reviewed on Rounds  Physical Exam:  Vitals: Temperature 98.6 pulse 96 respiratory 18 blood pressure 138/80 saturations 96%  Ventilator Settings currently off the ventilator on T collar  . General: Comfortable at this time . Eyes: Grossly normal lids, irises & conjunctiva . ENT: grossly tongue is normal . Neck: no obvious mass . Cardiovascular: S1 S2 normal no gallop . Respiratory: Coarse breath sounds with a few rhonchi . Abdomen: soft . Skin: no rash seen on limited exam . Musculoskeletal: not rigid . Psychiatric:unable to assess . Neurologic: no seizure no involuntary movements         Lab Data:   Basic Metabolic Panel: Recent Labs  Lab 10/27/18 0538 10/29/18 0600 10/30/18 1136 10/31/18 0630  NA 145 146*  --  146*  K 3.8 4.1  --  4.2  CL 108 106  --  109  CO2 24 29  --  26  GLUCOSE 145* 113*  --  150*  BUN 18 17  --  18  CREATININE 0.97 0.87  --  0.87  CALCIUM 9.0 8.9  --  9.0  MG  --   --  2.3 2.3    ABG: Recent Labs  Lab 10/26/18 2205  PHART 7.421  PCO2ART 41.3  PO2ART 86.7  HCO3 26.4  O2SAT 96.8    Liver Function Tests: Recent Labs  Lab 10/27/18 0538  AST 27  ALT 19  ALKPHOS 96  BILITOT 0.4  PROT 6.7  ALBUMIN 2.4*   No results for input(s): LIPASE, AMYLASE in the last 168 hours. No results for input(s): AMMONIA in the last 168 hours.  CBC: Recent Labs  Lab 10/27/18 0538 10/29/18 0600 10/31/18 0630  WBC 10.0 11.7* 13.2*  HGB 10.0* 9.3* 9.6*  HCT 32.4*  31.7* 32.6*  MCV 74.8* 76.2* 77.4*  PLT 262 292 309    Cardiac Enzymes: No results for input(s): CKTOTAL, CKMB, CKMBINDEX, TROPONINI in the last 168 hours.  BNP (last 3 results) No results for input(s): BNP in the last 8760 hours.  ProBNP (last 3 results) No results for input(s): PROBNP in the last 8760 hours.  Radiological Exams: No results found.  Assessment/Plan Active Problems:   Acute on chronic respiratory failure with hypoxia (HCC)   Acute right arterial ischemic stroke, middle cerebral artery (MCA) (HCC)   Seizure disorder (HCC)   Chronic obstructive asthma without status asthmaticus (HCC)   Tracheostomy status (HCC)   1. Acute on chronic respiratory failure with hypoxia we will continue with T collar trials continue secretion management pulmonary toilet rest on pressure support afterwards. 2. Acute right-sided stroke at baseline we will continue to monitor 3. Seizure disorder no active seizures 4. COPD PD with asthma stable 5. Tracheostomy remains in place   I have personally seen and evaluated the patient, evaluated laboratory and imaging results, formulated the assessment and plan and placed orders. The Patient requires high complexity decision making for assessment and support.  Case was discussed on Rounds with the Respiratory Therapy Staff  Allyne Gee, MD [facility] Pulmonary Critical Care Medicine Sleep Medicine

## 2018-11-01 DIAGNOSIS — J9621 Acute and chronic respiratory failure with hypoxia: Secondary | ICD-10-CM | POA: Diagnosis not present

## 2018-11-01 DIAGNOSIS — J449 Chronic obstructive pulmonary disease, unspecified: Secondary | ICD-10-CM | POA: Diagnosis not present

## 2018-11-01 DIAGNOSIS — G40909 Epilepsy, unspecified, not intractable, without status epilepticus: Secondary | ICD-10-CM | POA: Diagnosis not present

## 2018-11-01 DIAGNOSIS — I63511 Cerebral infarction due to unspecified occlusion or stenosis of right middle cerebral artery: Secondary | ICD-10-CM | POA: Diagnosis not present

## 2018-11-01 LAB — BLOOD GAS, ARTERIAL
ACID-BASE EXCESS: 1.1 mmol/L (ref 0.0–2.0)
BICARBONATE: 25.4 mmol/L (ref 20.0–28.0)
FIO2: 0.28
O2 Saturation: 97.2 %
Patient temperature: 98.1
pCO2 arterial: 41.5 mmHg (ref 32.0–48.0)
pH, Arterial: 7.402 (ref 7.350–7.450)
pO2, Arterial: 92.4 mmHg (ref 83.0–108.0)

## 2018-11-01 NOTE — Progress Notes (Signed)
[facility]   PULMONARY CRITICAL CARE SERVICE  PROGRESS NOTE  Date of Service: 11/01/2018  Vanessa West  WUJ:[id number]  DOB: 04-Aug-1976   DOA: 10/26/2018  Referring Physician: Carron CurieAli Hijazi, MD  HPI: Vanessa HamperChrystal Trefry is a 42 y.o. female seen for follow up of Acute on Chronic Respiratory Failure.  She is weaning on pressure support today not yet started on T collar.  Medications: Reviewed on Rounds  Physical Exam:  Vitals: Temperature 97.2 pulse rate 97 respiratory rate 25 blood pressure 126/75 saturations 97%  Ventilator Settings mode ventilation pressure support FiO2 28% tidal volume 451 pressure support 12/5  . General: Comfortable at this time . Eyes: Grossly normal lids, irises & conjunctiva . ENT: grossly tongue is normal . Neck: no obvious mass . Cardiovascular: S1 S2 normal no gallop . Respiratory: No rhonchi or rales are noted . Abdomen: soft . Skin: no rash seen on limited exam . Musculoskeletal: not rigid . Psychiatric:unable to assess . Neurologic: no seizure no involuntary movements         Lab Data:   Basic Metabolic Panel: Recent Labs  Lab 10/27/18 0538 10/29/18 0600 10/30/18 1136 10/31/18 0630  NA 145 146*  --  146*  K 3.8 4.1  --  4.2  CL 108 106  --  109  CO2 24 29  --  26  GLUCOSE 145* 113*  --  150*  BUN 18 17  --  18  CREATININE 0.97 0.87  --  0.87  CALCIUM 9.0 8.9  --  9.0  MG  --   --  2.3 2.3    ABG: Recent Labs  Lab 10/26/18 2205  PHART 7.421  PCO2ART 41.3  PO2ART 86.7  HCO3 26.4  O2SAT 96.8    Liver Function Tests: Recent Labs  Lab 10/27/18 0538  AST 27  ALT 19  ALKPHOS 96  BILITOT 0.4  PROT 6.7  ALBUMIN 2.4*   No results for input(s): LIPASE, AMYLASE in the last 168 hours. No results for input(s): AMMONIA in the last 168 hours.  CBC: Recent Labs  Lab 10/27/18 0538 10/29/18 0600 10/31/18 0630  WBC 10.0 11.7* 13.2*  HGB 10.0* 9.3* 9.6*  HCT 32.4* 31.7*  32.6*  MCV 74.8* 76.2* 77.4*  PLT 262 292 309    Cardiac Enzymes: No results for input(s): CKTOTAL, CKMB, CKMBINDEX, TROPONINI in the last 168 hours.  BNP (last 3 results) No results for input(s): BNP in the last 8760 hours.  ProBNP (last 3 results) No results for input(s): PROBNP in the last 8760 hours.  Radiological Exams: No results found.  Assessment/Plan Active Problems:   Acute on chronic respiratory failure with hypoxia (HCC)   Acute right arterial ischemic stroke, middle cerebral artery (MCA) (HCC)   Seizure disorder (HCC)   Chronic obstructive asthma without status asthmaticus (HCC)   Tracheostomy status (HCC)   1. Acute on chronic respiratory failure with hypoxia we will continue with weaning she needs to be switched over to T collar. 2. Acute right ischemic stroke unchanged we will continue present management 3. Seizure disorders stable no active seizures 4. Chronic asthma stable 5. Tracheostomy remains in place working towards weaning   I have personally seen and evaluated the patient, evaluated laboratory and imaging results, formulated the assessment and plan and placed orders. The Patient requires high complexity decision making for assessment and support.  Case was discussed on Rounds with the Respiratory Therapy Staff  Yevonne PaxSaadat A , MD [facility] Pulmonary  Critical Care Medicine Sleep Medicine

## 2018-11-02 DIAGNOSIS — I63511 Cerebral infarction due to unspecified occlusion or stenosis of right middle cerebral artery: Secondary | ICD-10-CM | POA: Diagnosis not present

## 2018-11-02 DIAGNOSIS — J9621 Acute and chronic respiratory failure with hypoxia: Secondary | ICD-10-CM | POA: Diagnosis not present

## 2018-11-02 DIAGNOSIS — G40909 Epilepsy, unspecified, not intractable, without status epilepticus: Secondary | ICD-10-CM | POA: Diagnosis not present

## 2018-11-02 DIAGNOSIS — J449 Chronic obstructive pulmonary disease, unspecified: Secondary | ICD-10-CM | POA: Diagnosis not present

## 2018-11-02 LAB — URINALYSIS, ROUTINE W REFLEX MICROSCOPIC
Glucose, UA: NEGATIVE mg/dL
Ketones, ur: 15 mg/dL — AB
Nitrite: POSITIVE — AB
Specific Gravity, Urine: 1.015 (ref 1.005–1.030)
pH: 7.5 (ref 5.0–8.0)

## 2018-11-02 LAB — CULTURE, BLOOD (ROUTINE X 2)
CULTURE: NO GROWTH
Culture: NO GROWTH
Special Requests: ADEQUATE
Special Requests: ADEQUATE

## 2018-11-02 LAB — URINALYSIS, MICROSCOPIC (REFLEX): RBC / HPF: >50 RBC/hpf (ref 0–5)

## 2018-11-02 NOTE — Progress Notes (Signed)
[facility]   PULMONARY CRITICAL CARE SERVICE  PROGRESS NOTE  Date of Service: 11/02/2018  Vanessa West  WUJ:[id number]  DOB: 15-Apr-1976   DOA: 10/26/2018  Referring Physician: Carron CurieAli Hijazi, MD  HPI: Vanessa HamperChrystal West is a 42 y.o. female seen for follow up of Acute on Chronic Respiratory Failure.  Patient is weaning on T collar she actually looked well.  Was on 28% oxygen good saturations were noted  Medications: Reviewed on Rounds  Physical Exam:  Vitals: Temperature 98.2 pulse 88 respiratory rate 18 blood pressure was 146/80 saturations 98%  Ventilator Settings off the ventilator on T collar currently  . General: Comfortable at this time . Eyes: Grossly normal lids, irises & conjunctiva . ENT: grossly tongue is normal . Neck: no obvious mass . Cardiovascular: S1 S2 normal no gallop . Respiratory: No rhonchi or rales are noted . Abdomen: soft . Skin: no rash seen on limited exam . Musculoskeletal: not rigid . Psychiatric:unable to assess . Neurologic: no seizure no involuntary movements         Lab Data:   Basic Metabolic Panel: Recent Labs  Lab 10/27/18 0538 10/29/18 0600 10/30/18 1136 10/31/18 0630  NA 145 146*  --  146*  K 3.8 4.1  --  4.2  CL 108 106  --  109  CO2 24 29  --  26  GLUCOSE 145* 113*  --  150*  BUN 18 17  --  18  CREATININE 0.97 0.87  --  0.87  CALCIUM 9.0 8.9  --  9.0  MG  --   --  2.3 2.3    ABG: Recent Labs  Lab 10/26/18 2205 11/01/18 1550  PHART 7.421 7.402  PCO2ART 41.3 41.5  PO2ART 86.7 92.4  HCO3 26.4 25.4  O2SAT 96.8 97.2    Liver Function Tests: Recent Labs  Lab 10/27/18 0538  AST 27  ALT 19  ALKPHOS 96  BILITOT 0.4  PROT 6.7  ALBUMIN 2.4*   No results for input(s): LIPASE, AMYLASE in the last 168 hours. No results for input(s): AMMONIA in the last 168 hours.  CBC: Recent Labs  Lab 10/27/18 0538 10/29/18 0600 10/31/18 0630  WBC 10.0 11.7* 13.2*  HGB  10.0* 9.3* 9.6*  HCT 32.4* 31.7* 32.6*  MCV 74.8* 76.2* 77.4*  PLT 262 292 309    Cardiac Enzymes: No results for input(s): CKTOTAL, CKMB, CKMBINDEX, TROPONINI in the last 168 hours.  BNP (last 3 results) No results for input(s): BNP in the last 8760 hours.  ProBNP (last 3 results) No results for input(s): PROBNP in the last 8760 hours.  Radiological Exams: No results found.  Assessment/Plan Active Problems:   Acute on chronic respiratory failure with hypoxia (HCC)   Acute right arterial ischemic stroke, middle cerebral artery (MCA) (HCC)   Seizure disorder (HCC)   Chronic obstructive asthma without status asthmaticus (HCC)   Tracheostomy status (HCC)   1. Acute on chronic respiratory failure with hypoxia we will continue with the weaning trials.  Patient is tolerating T collar very well.  Continue to advance as tolerated. 2. Acute ischemic stroke at baseline continue with supportive care 3. Seizure disorder no active seizures 4. Chronic obstructive asthma at baseline 5. Tracheostomy status will continue with supportive care   I have personally seen and evaluated the patient, evaluated laboratory and imaging results, formulated the assessment and plan and placed orders. The Patient requires high complexity decision making for assessment and support.  Case was  discussed on Rounds with the Respiratory Therapy Staff  Allyne Gee, MD [facility] Pulmonary Critical Care Medicine Sleep Medicine

## 2018-11-03 DIAGNOSIS — J9621 Acute and chronic respiratory failure with hypoxia: Secondary | ICD-10-CM | POA: Diagnosis not present

## 2018-11-03 DIAGNOSIS — I63511 Cerebral infarction due to unspecified occlusion or stenosis of right middle cerebral artery: Secondary | ICD-10-CM | POA: Diagnosis not present

## 2018-11-03 DIAGNOSIS — G40909 Epilepsy, unspecified, not intractable, without status epilepticus: Secondary | ICD-10-CM | POA: Diagnosis not present

## 2018-11-03 DIAGNOSIS — J449 Chronic obstructive pulmonary disease, unspecified: Secondary | ICD-10-CM | POA: Diagnosis not present

## 2018-11-03 LAB — HEMOGLOBIN AND HEMATOCRIT, BLOOD
HCT: 31 % — ABNORMAL LOW (ref 36.0–46.0)
Hemoglobin: 9.3 g/dL — ABNORMAL LOW (ref 12.0–15.0)

## 2018-11-03 NOTE — Progress Notes (Addendum)
[facility] [place]   PULMONARY CRITICAL CARE SERVICE  PROGRESS NOTE  Date of Service: 11/03/2018  Vanessa West  WUJ:[id number]  DOB: [birth year]   DOA: 10/26/2018  Referring Physician: Carron CurieAli Hijazi, MD  HPI: Vanessa West is a 42 y.o. female seen for follow up of Acute on Chronic Respiratory Failure.  Patient remains on 28% trach collar.  She did 12 hours yesterday and has a goal today of 16 hours.  She has a moderate amount of secretions.  Discussed placing scopolamine patch with staff.  Medications: Reviewed on Rounds  Physical Exam:  Vitals: Pulse 81 respirations 18 blood pressure 138/76 O2 saturation 98% temp 97.9  Ventilator Settings patient is not currently on the ventilator  . General: Comfortable at this time . Eyes: Grossly normal lids, irises & conjunctiva . ENT: grossly tongue is normal . Neck: no obvious mass . Cardiovascular: S1 S2 normal no gallop . Respiratory: Coarse breath sounds . Abdomen: soft . Skin: no rash seen on limited exam . Musculoskeletal: not rigid . Psychiatric:unable to assess . Neurologic: no seizure no involuntary movements         Lab Data:   Basic Metabolic Panel: Recent Labs  Lab 10/29/18 0600 10/30/18 1136 10/31/18 0630  NA 146*  --  146*  K 4.1  --  4.2  CL 106  --  109  CO2 29  --  26  GLUCOSE 113*  --  150*  BUN 17  --  18  CREATININE 0.87  --  0.87  CALCIUM 8.9  --  9.0  MG  --  2.3 2.3    ABG: Recent Labs  Lab 11/01/18 1550  PHART 7.402  PCO2ART 41.5  PO2ART 92.4  HCO3 25.4  O2SAT 97.2    Liver Function Tests: No results for input(s): AST, ALT, ALKPHOS, BILITOT, PROT, ALBUMIN in the last 168 hours. No results for input(s): LIPASE, AMYLASE in the last 168 hours. No results for input(s): AMMONIA in the last 168 hours.  CBC: Recent Labs  Lab 10/29/18 0600 10/31/18 0630 11/03/18 0808  WBC 11.7* 13.2*  --   HGB 9.3* 9.6* 9.3*  HCT 31.7* 32.6* 31.0*  MCV  76.2* 77.4*  --   PLT 292 309  --     Cardiac Enzymes: No results for input(s): CKTOTAL, CKMB, CKMBINDEX, TROPONINI in the last 168 hours.  BNP (last 3 results) No results for input(s): BNP in the last 8760 hours.  ProBNP (last 3 results) No results for input(s): PROBNP in the last 8760 hours.  Radiological Exams: No results found.  Assessment/Plan Active Problems:   Acute on chronic respiratory failure with hypoxia (HCC)   Acute right arterial ischemic stroke, middle cerebral artery (MCA) (HCC)   Seizure disorder (HCC)   Chronic obstructive asthma without status asthmaticus (HCC)   Tracheostomy status (HCC)   1. Acute on chronic respiratory failure with hypoxia continue with weaning.  Patient continues to tolerate trach collar very well.  Will address secretions with scopolamine.  Continue weaning as tolerated. 2. Acute ischemic stroke at baseline continue supportive care 3. Seizure disorder no active seizures noted 4. Chronic obstructive asthma at baseline continue supportive measures 5. Tracheostomy continue with supportive care   I have personally seen and evaluated the patient, evaluated laboratory and imaging results, formulated the assessment and plan and placed orders. The Patient requires high complexity decision making for assessment and support.  Case was discussed on Rounds with the Respiratory Therapy Staff  [facility] Hospital[facility]adat  Richardson Dopp, MD [facility] Pulmonary Critical Care Medicine Sleep Medicine

## 2018-11-04 DIAGNOSIS — J449 Chronic obstructive pulmonary disease, unspecified: Secondary | ICD-10-CM | POA: Diagnosis not present

## 2018-11-04 DIAGNOSIS — I63511 Cerebral infarction due to unspecified occlusion or stenosis of right middle cerebral artery: Secondary | ICD-10-CM | POA: Diagnosis not present

## 2018-11-04 DIAGNOSIS — G40909 Epilepsy, unspecified, not intractable, without status epilepticus: Secondary | ICD-10-CM | POA: Diagnosis not present

## 2018-11-04 DIAGNOSIS — J9621 Acute and chronic respiratory failure with hypoxia: Secondary | ICD-10-CM | POA: Diagnosis not present

## 2018-11-04 LAB — URINE CULTURE: Culture: NO GROWTH

## 2018-11-04 NOTE — Progress Notes (Addendum)
Pulmonary Critical Care Medicine [facility] [place]   PULMONARY CRITICAL CARE SERVICE  PROGRESS NOTE  Date of Service: 11/04/2018  Vanessa West  ZOX:[id number]  DOB: [birth year]   DOA: 10/26/2018  Referring Physician: Carron Curie, MD  HPI: Vanessa West is a 42 y.o. female seen for follow up of Acute on Chronic Respiratory Failure.  Patient is on trach collar 28% again today.  She did 16 hours yesterday with no difficulty.  Today's goal is 20 hours on the trach collar.  Secretions remain moderate at this time.  Medications: Reviewed on Rounds  Physical Exam:  Vitals: Pulse 81 respirations 16 blood pressure 127/74 O2 sat 98% temp 98.7  Ventilator Settings patient's not currently on ventilator  . General: Comfortable at this time . Eyes: Grossly normal lids, irises & conjunctiva . ENT: grossly tongue is normal . Neck: no obvious mass . Cardiovascular: S1 S2 normal no gallop . Respiratory: Coarse breath sounds . Abdomen: soft . Skin: no rash seen on limited exam . Musculoskeletal: not rigid . Psychiatric:unable to assess . Neurologic: no seizure no involuntary movements         Lab Data:   Basic Metabolic Panel: Recent Labs  Lab 10/29/18 0600 10/30/18 1136 10/31/18 0630  NA 146*  --  146*  K 4.1  --  4.2  CL 106  --  109  CO2 29  --  26  GLUCOSE 113*  --  150*  BUN 17  --  18  CREATININE 0.87  --  0.87  CALCIUM 8.9  --  9.0  MG  --  2.3 2.3    ABG: Recent Labs  Lab 11/01/18 1550  PHART 7.402  PCO2ART 41.5  PO2ART 92.4  HCO3 25.4  O2SAT 97.2    Liver Function Tests: No results for input(s): AST, ALT, ALKPHOS, BILITOT, PROT, ALBUMIN in the last 168 hours. No results for input(s): LIPASE, AMYLASE in the last 168 hours. No results for input(s): AMMONIA in the last 168 hours.  CBC: Recent Labs  Lab 10/29/18 0600 10/31/18 0630 11/03/18 0808  WBC 11.7* 13.2*  --   HGB 9.3* 9.6* 9.3*  HCT 31.7* 32.6* 31.0*  MCV 76.2* 77.4*  --    PLT 292 309  --     Cardiac Enzymes: No results for input(s): CKTOTAL, CKMB, CKMBINDEX, TROPONINI in the last 168 hours.  BNP (last 3 results) No results for input(s): BNP in the last 8760 hours.  ProBNP (last 3 results) No results for input(s): PROBNP in the last 8760 hours.  Radiological Exams: No results found.  Assessment/Plan Active Problems:   Acute on chronic respiratory failure with hypoxia (HCC)   Acute right arterial ischemic stroke, middle cerebral artery (MCA) (HCC)   Seizure disorder (HCC)   Chronic obstructive asthma without status asthmaticus (HCC)   Tracheostomy status (HCC)   1. Acute on chronic respiratory failure with hypoxia continue with weaning per protocol.  She is tolerating trach collar very well and has a goal of 20 hours today.  Secretions remain moderate and present. 2. Acute ischemic stroke at baseline 3. Seizure disorder no active seizures 4. Chronic obstructive asthma at baseline continue supportive measures 5. Tracheostomy continue supportive care   This patient was seen by Blima Ledger AGNP-C in Collaboration with Dr. Freda Munro as a part of collaborative care agreement.  I have personally seen and evaluated the patient, evaluated laboratory and imaging results, formulated the assessment and plan and placed orders. The Patient requires high complexity decision  making for assessment and support.  Case was discussed on Rounds with the Respiratory Therapy Staff  Yevonne PaxSaadat A Khayla Koppenhaver, MD [facility] Pulmonary Critical Care Medicine Sleep Medicine

## 2018-11-05 DIAGNOSIS — J449 Chronic obstructive pulmonary disease, unspecified: Secondary | ICD-10-CM | POA: Diagnosis not present

## 2018-11-05 DIAGNOSIS — J9621 Acute and chronic respiratory failure with hypoxia: Secondary | ICD-10-CM | POA: Diagnosis not present

## 2018-11-05 DIAGNOSIS — G40909 Epilepsy, unspecified, not intractable, without status epilepticus: Secondary | ICD-10-CM | POA: Diagnosis not present

## 2018-11-05 DIAGNOSIS — I63511 Cerebral infarction due to unspecified occlusion or stenosis of right middle cerebral artery: Secondary | ICD-10-CM | POA: Diagnosis not present

## 2018-11-05 NOTE — Progress Notes (Signed)
[facility] [place]   PULMONARY CRITICAL CARE SERVICE  PROGRESS NOTE  Date of Service: 11/05/2018  Blaine HamperChrystal Palermo  ZOX:[id number]:[id number]  DOB: 1976/07/04   DOA: 10/26/2018  Referring Physician: Carron CurieAli Hijazi, MD  HPI: Blaine HamperChrystal Hitz is a 42 y.o. female seen for follow up of Acute on Chronic Respiratory Failure.  Patient is wean protocol.  The goal is for 24 hours on T collar doing well so far  Medications: Reviewed on Rounds  Physical Exam:  Vitals: Temperature 99.8 pulse 89 respiratory rate 19 blood pressure 137/92 saturations 97%  Ventilator Settings off ventilator on T collar trials  . General: Comfortable at this time . Eyes: Grossly normal lids, irises & conjunctiva . ENT: grossly tongue is normal . Neck: no obvious mass . Cardiovascular: S1 S2 normal no gallop . Respiratory: No rhonchi or rales are noted at this time . Abdomen: soft . Skin: no rash seen on limited exam . Musculoskeletal: not rigid . Psychiatric:unable to assess . Neurologic: no seizure no involuntary movements         Lab Data:   Basic Metabolic Panel: Recent Labs  Lab 10/30/18 1136 10/31/18 0630  NA  --  146*  K  --  4.2  CL  --  109  CO2  --  26  GLUCOSE  --  150*  BUN  --  18  CREATININE  --  0.87  CALCIUM  --  9.0  MG 2.3 2.3    ABG: Recent Labs  Lab 11/01/18 1550  PHART 7.402  PCO2ART 41.5  PO2ART 92.4  HCO3 25.4  O2SAT 97.2    Liver Function Tests: No results for input(s): AST, ALT, ALKPHOS, BILITOT, PROT, ALBUMIN in the last 168 hours. No results for input(s): LIPASE, AMYLASE in the last 168 hours. No results for input(s): AMMONIA in the last 168 hours.  CBC: Recent Labs  Lab 10/31/18 0630 11/03/18 0808  WBC 13.2*  --   HGB 9.6* 9.3*  HCT 32.6* 31.0*  MCV 77.4*  --   PLT 309  --     Cardiac Enzymes: No results for input(s): CKTOTAL, CKMB, CKMBINDEX, TROPONINI in the last 168 hours.  BNP (last 3 results) No results  for input(s): BNP in the last 8760 hours.  ProBNP (last 3 results) No results for input(s): PROBNP in the last 8760 hours.  Radiological Exams: No results found.  Assessment/Plan Active Problems:   Acute on chronic respiratory failure with hypoxia (HCC)   Acute right arterial ischemic stroke, middle cerebral artery (MCA) (HCC)   Seizure disorder (HCC)   Chronic obstructive asthma without status asthmaticus (HCC)   Tracheostomy status (HCC)   1. Acute on chronic respiratory failure with hypoxia we will continue with weaning on T collar continue pulmonary toilet secretion management. 2. Acute ischemic stroke improving we will continue to follow 3. Seizure disorder no active seizures 4. Chronic obstructive asthma at baseline stable 5. Tracheostomy will continue with supportive care   I have personally seen and evaluated the patient, evaluated laboratory and imaging results, formulated the assessment and plan and placed orders. The Patient requires high complexity decision making for assessment and support.  Case was discussed on Rounds with the Respiratory Therapy Staff  Yevonne PaxSaadat A Kaelin Bonelli, MD [facility] Pulmonary Critical Care Medicine Sleep Medicine

## 2018-11-06 DIAGNOSIS — J9621 Acute and chronic respiratory failure with hypoxia: Secondary | ICD-10-CM | POA: Diagnosis not present

## 2018-11-06 DIAGNOSIS — J449 Chronic obstructive pulmonary disease, unspecified: Secondary | ICD-10-CM | POA: Diagnosis not present

## 2018-11-06 DIAGNOSIS — G40909 Epilepsy, unspecified, not intractable, without status epilepticus: Secondary | ICD-10-CM | POA: Diagnosis not present

## 2018-11-06 DIAGNOSIS — I63511 Cerebral infarction due to unspecified occlusion or stenosis of right middle cerebral artery: Secondary | ICD-10-CM | POA: Diagnosis not present

## 2018-11-06 NOTE — Progress Notes (Signed)
[facility] [place]   PULMONARY CRITICAL CARE SERVICE  PROGRESS NOTE  Date of Service: 11/06/2018  Vanessa HamperChrystal Wassel  RUE:[id number]:[id number]  DOB: 05-Dec-1975   DOA: 10/26/2018  Referring Physician: Carron CurieAli Hijazi, MD  HPI: Vanessa HamperChrystal Latimore is a 42 y.o. female seen for follow up of Acute on Chronic Respiratory Failure.  Currently on T collar has been on 28% FiO2 for more than 48 hours  Medications: Reviewed on Rounds  Physical Exam:  Vitals: Temperature 98.5 pulse 81 respiratory 21 blood pressure 153/97 saturations 97%  Ventilator Settings off the ventilator on T collar  . General: Comfortable at this time . Eyes: Grossly normal lids, irises & conjunctiva . ENT: grossly tongue is normal . Neck: no obvious mass . Cardiovascular: S1 S2 normal no gallop . Respiratory: No rhonchi no rales are noted . Abdomen: soft . Skin: no rash seen on limited exam . Musculoskeletal: not rigid . Psychiatric:unable to assess . Neurologic: no seizure no involuntary movements         Lab Data:   Basic Metabolic Panel: Recent Labs  Lab 10/31/18 0630  NA 146*  K 4.2  CL 109  CO2 26  GLUCOSE 150*  BUN 18  CREATININE 0.87  CALCIUM 9.0  MG 2.3    ABG: Recent Labs  Lab 11/01/18 1550  PHART 7.402  PCO2ART 41.5  PO2ART 92.4  HCO3 25.4  O2SAT 97.2    Liver Function Tests: No results for input(s): AST, ALT, ALKPHOS, BILITOT, PROT, ALBUMIN in the last 168 hours. No results for input(s): LIPASE, AMYLASE in the last 168 hours. No results for input(s): AMMONIA in the last 168 hours.  CBC: Recent Labs  Lab 10/31/18 0630 11/03/18 0808  WBC 13.2*  --   HGB 9.6* 9.3*  HCT 32.6* 31.0*  MCV 77.4*  --   PLT 309  --     Cardiac Enzymes: No results for input(s): CKTOTAL, CKMB, CKMBINDEX, TROPONINI in the last 168 hours.  BNP (last 3 results) No results for input(s): BNP in the last 8760 hours.  ProBNP (last 3 results) No results for input(s):  PROBNP in the last 8760 hours.  Radiological Exams: No results found.  Assessment/Plan Active Problems:   Acute on chronic respiratory failure with hypoxia (HCC)   Acute right arterial ischemic stroke, middle cerebral artery (MCA) (HCC)   Seizure disorder (HCC)   Chronic obstructive asthma without status asthmaticus (HCC)   Tracheostomy status (HCC)   1. Acute on chronic respiratory failure with hypoxia we will continue with weaning on T collar continue secretion management pulmonary toilet. 2. Acute right sided ischemic stroke she is at baseline therapy as tolerated 3. Seizure disorder no active seizures 4. Chronic obstructive asthma at baseline we will continue to monitor 5. Tracheostomy status will be working towards capping and decannulation eventually   I have personally seen and evaluated the patient, evaluated laboratory and imaging results, formulated the assessment and plan and placed orders. The Patient requires high complexity decision making for assessment and support.  Case was discussed on Rounds with the Respiratory Therapy Staff  Yevonne PaxSaadat A Khan, MD [facility] Pulmonary Critical Care Medicine Sleep Medicine

## 2018-11-07 DIAGNOSIS — I63511 Cerebral infarction due to unspecified occlusion or stenosis of right middle cerebral artery: Secondary | ICD-10-CM | POA: Diagnosis not present

## 2018-11-07 DIAGNOSIS — G40909 Epilepsy, unspecified, not intractable, without status epilepticus: Secondary | ICD-10-CM | POA: Diagnosis not present

## 2018-11-07 DIAGNOSIS — J449 Chronic obstructive pulmonary disease, unspecified: Secondary | ICD-10-CM | POA: Diagnosis not present

## 2018-11-07 DIAGNOSIS — J9621 Acute and chronic respiratory failure with hypoxia: Secondary | ICD-10-CM | POA: Diagnosis not present

## 2018-11-07 NOTE — Progress Notes (Signed)
Pulmonary Critical Care Medicine [facility] [place]   PULMONARY CRITICAL CARE SERVICE  PROGRESS NOTE  Date of Service: 11/07/2018  Vanessa West  NWG:[id number]  DOB: May 27, 1976   DOA: 10/26/2018  Referring Physician: Carron CurieAli Hijazi, MD  HPI: Vanessa West is a 42 y.o. female seen for follow up of Acute on Chronic Respiratory Failure.  Patient currently is on T collar at this time is been on 28% on oxygen has been off of the ventilator now for 48 hours  Medications: Reviewed on Rounds  Physical Exam:  Vitals: Temperature 98.7 pulse 76 respiratory rate 15 blood pressure 125/70 saturations 100%  Ventilator Settings of the ventilator on T collar currently on 28% FiO2  . General: Comfortable at this time . Eyes: Grossly normal lids, irises & conjunctiva . ENT: grossly tongue is normal . Neck: no obvious mass . Cardiovascular: S1 S2 normal no gallop . Respiratory: No rhonchi no rales are noted at this time . Abdomen: soft . Skin: no rash seen on limited exam . Musculoskeletal: not rigid . Psychiatric:unable to assess . Neurologic: no seizure no involuntary movements         Lab Data:   Basic Metabolic Panel: No results for input(s): NA, K, CL, CO2, GLUCOSE, BUN, CREATININE, CALCIUM, MG, PHOS in the last 168 hours.  ABG: Recent Labs  Lab 11/01/18 1550  PHART 7.402  PCO2ART 41.5  PO2ART 92.4  HCO3 25.4  O2SAT 97.2    Liver Function Tests: No results for input(s): AST, ALT, ALKPHOS, BILITOT, PROT, ALBUMIN in the last 168 hours. No results for input(s): LIPASE, AMYLASE in the last 168 hours. No results for input(s): AMMONIA in the last 168 hours.  CBC: Recent Labs  Lab 11/03/18 0808  HGB 9.3*  HCT 31.0*    Cardiac Enzymes: No results for input(s): CKTOTAL, CKMB, CKMBINDEX, TROPONINI in the last 168 hours.  BNP (last 3 results) No results for input(s): BNP in the last 8760 hours.  ProBNP (last 3 results) No results for input(s): PROBNP in the  last 8760 hours.  Radiological Exams: No results found.  Assessment/Plan Active Problems:   Acute on chronic respiratory failure with hypoxia (HCC)   Acute right arterial ischemic stroke, middle cerebral artery (MCA) (HCC)   Seizure disorder (HCC)   Chronic obstructive asthma without status asthmaticus (HCC)   Tracheostomy status (HCC)   1. Acute on chronic respiratory failure with hypoxia continue T collar trials continue pulmonary toilet patient secretions have been reportedly minimal so hopefully we should be able to advance to capping 2. Acute ischemic stroke at baseline remains nonverbal. 3. Seizure disorder no active seizures. 4. Chronic obstructive asthma stable we will continue to monitor 5. Tracheostomy remains in place we will continue with present management   I have personally seen and evaluated the patient, evaluated laboratory and imaging results, formulated the assessment and plan and placed orders. The Patient requires high complexity decision making for assessment and support.  Case was discussed on Rounds with the Respiratory Therapy Staff  Yevonne PaxSaadat A Kallan Bischoff, MD [facility] Pulmonary Critical Care Medicine Sleep Medicine

## 2018-11-08 DIAGNOSIS — I63511 Cerebral infarction due to unspecified occlusion or stenosis of right middle cerebral artery: Secondary | ICD-10-CM | POA: Diagnosis not present

## 2018-11-08 DIAGNOSIS — G40909 Epilepsy, unspecified, not intractable, without status epilepticus: Secondary | ICD-10-CM | POA: Diagnosis not present

## 2018-11-08 DIAGNOSIS — J9621 Acute and chronic respiratory failure with hypoxia: Secondary | ICD-10-CM | POA: Diagnosis not present

## 2018-11-08 DIAGNOSIS — J449 Chronic obstructive pulmonary disease, unspecified: Secondary | ICD-10-CM | POA: Diagnosis not present

## 2018-11-08 NOTE — Progress Notes (Addendum)
[facility] [place]   PULMONARY CRITICAL CARE SERVICE  PROGRESS NOTE  Date of Service: 11/08/2018  Blaine HamperChrystal Andis  ZOX:[id number]  DOB: [birth year]   DOA: 10/26/2018  Referring Physician: Carron CurieAli Hijazi, MD  HPI: Blaine HamperChrystal Daidone is a 42 y.o. female seen for follow up of Acute on Chronic Respiratory Failure.  Patient is currently on trach collar at 28% oxygen.  She has been using a PMV today with no difficulty.  She has now been without her ventilator for 72 hours.  Medications: Reviewed on Rounds  Physical Exam:  Vitals: Pulse 63 respirations 16 blood pressure 132/85 O2 sat 99% temperature 95.5  Ventilator Settings patient's not currently on ventilator  . General: Comfortable at this time . Eyes: Grossly normal lids, irises & conjunctiva . ENT: grossly tongue is normal . Neck: no obvious mass . Cardiovascular: S1 S2 normal no gallop . Respiratory: No wheezes or rhonchi noted . Abdomen: soft . Skin: no rash seen on limited exam . Musculoskeletal: not rigid . Psychiatric:unable to assess . Neurologic: no seizure no involuntary movements         Lab Data:   Basic Metabolic Panel: No results for input(s): NA, K, CL, CO2, GLUCOSE, BUN, CREATININE, CALCIUM, MG, PHOS in the last 168 hours.  ABG: No results for input(s): PHART, PCO2ART, PO2ART, HCO3, O2SAT in the last 168 hours.  Liver Function Tests: No results for input(s): AST, ALT, ALKPHOS, BILITOT, PROT, ALBUMIN in the last 168 hours. No results for input(s): LIPASE, AMYLASE in the last 168 hours. No results for input(s): AMMONIA in the last 168 hours.  CBC: Recent Labs  Lab 11/03/18 0808  HGB 9.3*  HCT 31.0*    Cardiac Enzymes: No results for input(s): CKTOTAL, CKMB, CKMBINDEX, TROPONINI in the last 168 hours.  BNP (last 3 results) No results for input(s): BNP in the last 8760 hours.  ProBNP (last 3 results) No results for input(s): PROBNP in the last 8760  hours.  Radiological Exams: No results found.  Assessment/Plan Active Problems:   Acute on chronic respiratory failure with hypoxia (HCC)   Acute right arterial ischemic stroke, middle cerebral artery (MCA) (HCC)   Seizure disorder (HCC)   Chronic obstructive asthma without status asthmaticus (HCC)   Tracheostomy status (HCC)   1. Acute on chronic respiratory failure with hypoxia continue trach collar trials and pulmonary toilet.  Patient should advance to capping tomorrow. 2. Acute ischemic stroke at baseline remains nonverbal 3. Seizure disorder no active seizures 4. Chronic obstructive asthma stable continue to monitor 5. Tracheostomy remains in place continue present management   I have personally seen and evaluated the patient, evaluated laboratory and imaging results, formulated the assessment and plan and placed orders. The Patient requires high complexity decision making for assessment and support.  Case was discussed on Rounds with the Respiratory Therapy Staff  Yevonne PaxSaadat A Khan, MD [facility] Pulmonary Critical Care Medicine Sleep Medicine

## 2018-11-09 DIAGNOSIS — J449 Chronic obstructive pulmonary disease, unspecified: Secondary | ICD-10-CM | POA: Diagnosis not present

## 2018-11-09 DIAGNOSIS — G40909 Epilepsy, unspecified, not intractable, without status epilepticus: Secondary | ICD-10-CM | POA: Diagnosis not present

## 2018-11-09 DIAGNOSIS — I63511 Cerebral infarction due to unspecified occlusion or stenosis of right middle cerebral artery: Secondary | ICD-10-CM | POA: Diagnosis not present

## 2018-11-09 DIAGNOSIS — J9621 Acute and chronic respiratory failure with hypoxia: Secondary | ICD-10-CM | POA: Diagnosis not present

## 2018-11-09 LAB — BASIC METABOLIC PANEL
ANION GAP: 12 (ref 5–15)
BUN: 18 mg/dL (ref 6–20)
CO2: 27 mmol/L (ref 22–32)
Calcium: 8.9 mg/dL (ref 8.9–10.3)
Chloride: 108 mmol/L (ref 98–111)
Creatinine, Ser: 0.93 mg/dL (ref 0.44–1.00)
GFR calc Af Amer: >60 mL/min (ref >60)
GFR calc non Af Amer: >60 mL/min (ref >60)
Glucose, Bld: 152 mg/dL — ABNORMAL HIGH (ref 70–99)
POTASSIUM: 3.2 mmol/L — AB (ref 3.5–5.1)
Sodium: 147 mmol/L — ABNORMAL HIGH (ref 135–145)

## 2018-11-09 LAB — CBC
HCT: 33.4 % — ABNORMAL LOW (ref 36.0–46.0)
Hemoglobin: 10.3 g/dL — ABNORMAL LOW (ref 12.0–15.0)
MCH: 23.4 pg — ABNORMAL LOW (ref 26.0–34.0)
MCHC: 30.8 g/dL (ref 30.0–36.0)
MCV: 75.9 fL — ABNORMAL LOW (ref 80.0–100.0)
NRBC: 0.2 % (ref 0.0–0.2)
Platelets: 287 10*3/uL (ref 150–400)
RBC: 4.4 MIL/uL (ref 3.87–5.11)
RDW: 17 % — ABNORMAL HIGH (ref 11.5–15.5)
WBC: 9.2 10*3/uL (ref 4.0–10.5)

## 2018-11-09 NOTE — Progress Notes (Signed)
[facility] [place]   PULMONARY CRITICAL CARE SERVICE  PROGRESS NOTE  Date of Service: 11/09/2018  Blaine HamperChrystal Cesaro  ZOX:[id number]  DOB: Mar 21, 1976   DOA: 10/26/2018  Referring Physician: Carron CurieAli Hijazi, MD  HPI: Blaine HamperChrystal Jans is a 42 y.o. female seen for follow up of Acute on Chronic Respiratory Failure.  Patient is doing well she is been tolerating T collar and also has been tolerating PMV fairly well.  She has had no issues with secretions.  Medications: Reviewed on Rounds  Physical Exam:  Vitals: Temperature 97.5 pulse 85 respiratory 23 blood pressure 140/88 saturations are 98%  Ventilator Settings currently is off the ventilator on T collar with PMV  . General: Comfortable at this time . Eyes: Grossly normal lids, irises & conjunctiva . ENT: grossly tongue is normal . Neck: no obvious mass . Cardiovascular: S1 S2 normal no gallop . Respiratory: No rhonchi no rales are noted at this time . Abdomen: soft . Skin: no rash seen on limited exam . Musculoskeletal: not rigid . Psychiatric:unable to assess . Neurologic: no seizure no involuntary movements         Lab Data:   Basic Metabolic Panel: Recent Labs  Lab 11/09/18 0613  NA 147*  K 3.2*  CL 108  CO2 27  GLUCOSE 152*  BUN 18  CREATININE 0.93  CALCIUM 8.9    ABG: No results for input(s): PHART, PCO2ART, PO2ART, HCO3, O2SAT in the last 168 hours.  Liver Function Tests: No results for input(s): AST, ALT, ALKPHOS, BILITOT, PROT, ALBUMIN in the last 168 hours. No results for input(s): LIPASE, AMYLASE in the last 168 hours. No results for input(s): AMMONIA in the last 168 hours.  CBC: Recent Labs  Lab 11/03/18 0808 11/09/18 0613  WBC  --  9.2  HGB 9.3* 10.3*  HCT 31.0* 33.4*  MCV  --  75.9*  PLT  --  287    Cardiac Enzymes: No results for input(s): CKTOTAL, CKMB, CKMBINDEX, TROPONINI in the last 168 hours.  BNP (last 3 results) No results for input(s):  BNP in the last 8760 hours.  ProBNP (last 3 results) No results for input(s): PROBNP in the last 8760 hours.  Radiological Exams: No results found.  Assessment/Plan Active Problems:   Acute on chronic respiratory failure with hypoxia (HCC)   Acute right arterial ischemic stroke, middle cerebral artery (MCA) (HCC)   Seizure disorder (HCC)   Chronic obstructive asthma without status asthmaticus (HCC)   Tracheostomy status (HCC)   1. Acute on chronic respiratory failure with hypoxia we will continue with advancing the weaning as per the protocol.  Will continue secretion management pulmonary toilet. 2. Acute ischemic stroke remains grossly unchanged we will continue to monitor 3. Seizure disorder no active seizures are noted 4. Chronic obstructive asthma stable at this time we will continue with supportive care 5. Tracheostomy working towards eventual removal   I have personally seen and evaluated the patient, evaluated laboratory and imaging results, formulated the assessment and plan and placed orders. The Patient requires high complexity decision making for assessment and support.  Case was discussed on Rounds with the Respiratory Therapy Staff  Yevonne PaxSaadat A Shellsea Borunda, MD [facility] Pulmonary Critical Care Medicine Sleep Medicine

## 2018-11-10 DIAGNOSIS — I63511 Cerebral infarction due to unspecified occlusion or stenosis of right middle cerebral artery: Secondary | ICD-10-CM | POA: Diagnosis not present

## 2018-11-10 DIAGNOSIS — J449 Chronic obstructive pulmonary disease, unspecified: Secondary | ICD-10-CM | POA: Diagnosis not present

## 2018-11-10 DIAGNOSIS — G40909 Epilepsy, unspecified, not intractable, without status epilepticus: Secondary | ICD-10-CM | POA: Diagnosis not present

## 2018-11-10 DIAGNOSIS — J9621 Acute and chronic respiratory failure with hypoxia: Secondary | ICD-10-CM | POA: Diagnosis not present

## 2018-11-10 NOTE — Progress Notes (Addendum)
[facility]   PULMONARY CRITICAL CARE SERVICE  PROGRESS NOTE  Date of Service: 11/10/2018  Vanessa West  AOZ:[id number]:[id number]  DOB: June 20, 1976   DOA: 10/26/2018  Referring Physician: Carron CurieAli Hijazi, MD  HPI: Vanessa West is a 42 y.o. female seen for follow up of Acute on Chronic Respiratory Failure.  Patient continues to do well on 28% trach collar.  She has minimal secretions and uses a PMV valve.  Medications: Reviewed on Rounds  Physical Exam:  Vitals: Pulse 62 respirations 16 blood pressure 150/86 O2 sat 98% temp 97.9  Ventilator Settings patient's not currently on ventilator  . General: Comfortable at this time . Eyes: Grossly normal lids, irises & conjunctiva . ENT: grossly tongue is normal . Neck: no obvious mass . Cardiovascular: S1 S2 normal no gallop . Respiratory: No wheezes or rhonchi noted . Abdomen: soft . Skin: no rash seen on limited exam . Musculoskeletal: not rigid . Psychiatric:unable to assess . Neurologic: no seizure no involuntary movements         Lab Data:   Basic Metabolic Panel: Recent Labs  Lab 11/09/18 0613  NA 147*  K 3.2*  CL 108  CO2 27  GLUCOSE 152*  BUN 18  CREATININE 0.93  CALCIUM 8.9    ABG: No results for input(s): PHART, PCO2ART, PO2ART, HCO3, O2SAT in the last 168 hours.  Liver Function Tests: No results for input(s): AST, ALT, ALKPHOS, BILITOT, PROT, ALBUMIN in the last 168 hours. No results for input(s): LIPASE, AMYLASE in the last 168 hours. No results for input(s): AMMONIA in the last 168 hours.  CBC: Recent Labs  Lab 11/09/18 0613  WBC 9.2  HGB 10.3*  HCT 33.4*  MCV 75.9*  PLT 287    Cardiac Enzymes: No results for input(s): CKTOTAL, CKMB, CKMBINDEX, TROPONINI in the last 168 hours.  BNP (last 3 results) No results for input(s): BNP in the last 8760 hours.  ProBNP (last 3 results) No results for input(s): PROBNP in the last 8760  hours.  Radiological Exams: No results found.  Assessment/Plan Active Problems:   Acute on chronic respiratory failure with hypoxia (HCC)   Acute right arterial ischemic stroke, middle cerebral artery (MCA) (HCC)   Seizure disorder (HCC)   Chronic obstructive asthma without status asthmaticus (HCC)   Tracheostomy status (HCC)   1. Acute on chronic respiratory failure with hypoxia continue managing secretions with pulmonary toilet. 2. Acute ischemic stroke remains unchanged continue to monitor 3. Seizure disorder no active seizures 4. Chronic obstructive asthma stable at this time continue supportive care 5. Tracheostomy working towards eventual decannulation.   I have personally seen and evaluated the patient, evaluated laboratory and imaging results, formulated the assessment and plan and placed orders. The Patient requires high complexity decision making for assessment and support.  Case was discussed on Rounds with the Respiratory Therapy Staff  Yevonne PaxSaadat A Zaedyn Covin, MD [facility] Pulmonary Critical Care Medicine Sleep Medicine

## 2018-11-11 DIAGNOSIS — J449 Chronic obstructive pulmonary disease, unspecified: Secondary | ICD-10-CM | POA: Diagnosis not present

## 2018-11-11 DIAGNOSIS — J9621 Acute and chronic respiratory failure with hypoxia: Secondary | ICD-10-CM | POA: Diagnosis not present

## 2018-11-11 DIAGNOSIS — G40909 Epilepsy, unspecified, not intractable, without status epilepticus: Secondary | ICD-10-CM | POA: Diagnosis not present

## 2018-11-11 DIAGNOSIS — I63511 Cerebral infarction due to unspecified occlusion or stenosis of right middle cerebral artery: Secondary | ICD-10-CM | POA: Diagnosis not present

## 2018-11-11 NOTE — Progress Notes (Addendum)
[facility]   PULMONARY CRITICAL CARE SERVICE  PROGRESS NOTE  Date of Service: 11/11/2018  Vanessa HamperChrystal Tech  ZOX:[id number]:[id number]  DOB: 04-01-1976   DOA: 10/26/2018  Referring Physician: Carron CurieAli Hijazi, MD  HPI: Vanessa West is a 42 y.o. female seen for follow up of Acute on Chronic Respiratory Failure.  Patient continues to have a small amount of secretions.  She is using her PMV with no difficulty.  Her trach collar remains at 28% at this time.  Medications: Reviewed on Rounds  Physical Exam:  Vitals: Pulse 59 respirations 15 blood pressure 139/83 O2 sat 98% temp 98.2  Ventilator Settings patient not currently on ventilator  . General: Comfortable at this time . Eyes: Grossly normal lids, irises & conjunctiva . ENT: grossly tongue is normal . Neck: no obvious mass . Cardiovascular: S1 S2 normal no gallop . Respiratory: No wheezes or rhonchi noted . Abdomen: soft . Skin: no rash seen on limited exam . Musculoskeletal: not rigid . Psychiatric:unable to assess . Neurologic: no seizure no involuntary movements         Lab Data:   Basic Metabolic Panel: Recent Labs  Lab 11/09/18 0613  NA 147*  K 3.2*  CL 108  CO2 27  GLUCOSE 152*  BUN 18  CREATININE 0.93  CALCIUM 8.9    ABG: No results for input(s): PHART, PCO2ART, PO2ART, HCO3, O2SAT in the last 168 hours.  Liver Function Tests: No results for input(s): AST, ALT, ALKPHOS, BILITOT, PROT, ALBUMIN in the last 168 hours. No results for input(s): LIPASE, AMYLASE in the last 168 hours. No results for input(s): AMMONIA in the last 168 hours.  CBC: Recent Labs  Lab 11/09/18 0613  WBC 9.2  HGB 10.3*  HCT 33.4*  MCV 75.9*  PLT 287    Cardiac Enzymes: No results for input(s): CKTOTAL, CKMB, CKMBINDEX, TROPONINI in the last 168 hours.  BNP (last 3 results) No results for input(s): BNP in the last 8760 hours.  ProBNP (last 3 results) No results for input(s):  PROBNP in the last 8760 hours.  Radiological Exams: No results found.  Assessment/Plan Active Problems:   Acute on chronic respiratory failure with hypoxia (HCC)   Acute right arterial ischemic stroke, middle cerebral artery (MCA) (HCC)   Seizure disorder (HCC)   Chronic obstructive asthma without status asthmaticus (HCC)   Tracheostomy status (HCC)   1. Acute on chronic respiratory failure with hypoxia continue secretion management and pulmonary toilet. 2. Acute ischemic stroke remains unchanged continue to monitor 3. Seizure disorder no active seizures 4. Chronic obstructive asthma stable at this time 5. Tracheostomy patient working towards eventual decannulation   I have personally seen and evaluated the patient, evaluated laboratory and imaging results, formulated the assessment and plan and placed orders. The Patient requires high complexity decision making for assessment and support.  Case was discussed on Rounds with the Respiratory Therapy Staff  Yevonne PaxSaadat A Addalyne Vandehei, MD [facility] Pulmonary Critical Care Medicine Sleep Medicine

## 2018-11-12 DIAGNOSIS — J9621 Acute and chronic respiratory failure with hypoxia: Secondary | ICD-10-CM | POA: Diagnosis not present

## 2018-11-12 DIAGNOSIS — J449 Chronic obstructive pulmonary disease, unspecified: Secondary | ICD-10-CM | POA: Diagnosis not present

## 2018-11-12 DIAGNOSIS — I63511 Cerebral infarction due to unspecified occlusion or stenosis of right middle cerebral artery: Secondary | ICD-10-CM | POA: Diagnosis not present

## 2018-11-12 DIAGNOSIS — G40909 Epilepsy, unspecified, not intractable, without status epilepticus: Secondary | ICD-10-CM | POA: Diagnosis not present

## 2018-11-12 NOTE — Progress Notes (Signed)
[facility]   PULMONARY CRITICAL CARE SERVICE  PROGRESS NOTE  Date of Service: 11/12/2018  Blaine HamperChrystal Morgenthaler  ZOX:[id number]  DOB: 27-Nov-1975   DOA: 10/26/2018  Referring Physician: Carron CurieAli Hijazi, MD  HPI: Blaine HamperChrystal Donnellan is a 42 y.o. female seen for follow up of Acute on Chronic Respiratory Failure.  Patient is doing well with T collar.  Her cousin was here and she actually did speak to her cousin.  She normally does not respond to staff.  Medications: Reviewed on Rounds  Physical Exam:  Vitals: Temperature 98.2 pulse 61 respiratory 15 blood pressure 124/79 saturations 99%  Ventilator Settings off the ventilator on T collar  . General: Comfortable at this time . Eyes: Grossly normal lids, irises & conjunctiva . ENT: grossly tongue is normal . Neck: no obvious mass . Cardiovascular: S1 S2 normal no gallop . Respiratory: Scattered rhonchi bilaterally . Abdomen: soft . Skin: no rash seen on limited exam . Musculoskeletal: not rigid . Psychiatric:unable to assess . Neurologic: no seizure no involuntary movements         Lab Data:   Basic Metabolic Panel: Recent Labs  Lab 11/09/18 0613  NA 147*  K 3.2*  CL 108  CO2 27  GLUCOSE 152*  BUN 18  CREATININE 0.93  CALCIUM 8.9    ABG: No results for input(s): PHART, PCO2ART, PO2ART, HCO3, O2SAT in the last 168 hours.  Liver Function Tests: No results for input(s): AST, ALT, ALKPHOS, BILITOT, PROT, ALBUMIN in the last 168 hours. No results for input(s): LIPASE, AMYLASE in the last 168 hours. No results for input(s): AMMONIA in the last 168 hours.  CBC: Recent Labs  Lab 11/09/18 0613  WBC 9.2  HGB 10.3*  HCT 33.4*  MCV 75.9*  PLT 287    Cardiac Enzymes: No results for input(s): CKTOTAL, CKMB, CKMBINDEX, TROPONINI in the last 168 hours.  BNP (last 3 results) No results for input(s): BNP in the last 8760 hours.  ProBNP (last 3 results) No results for  input(s): PROBNP in the last 8760 hours.  Radiological Exams: No results found.  Assessment/Plan Active Problems:   Acute on chronic respiratory failure with hypoxia (HCC)   Acute right arterial ischemic stroke, middle cerebral artery (MCA) (HCC)   Seizure disorder (HCC)   Chronic obstructive asthma without status asthmaticus (HCC)   Tracheostomy status (HCC)   1. Acute on chronic respiratory failure with hypoxia patient is going to continue with T collar trials will change her trach over to a cuffless trach. 2. Acute right-sided ischemic stroke continue with supportive care. 3. Seizure disorder at baseline we will monitor 4. COPD with asthma continue present management 5. Tracheostomy will change today to a #4 cuffless   I have personally seen and evaluated the patient, evaluated laboratory and imaging results, formulated the assessment and plan and placed orders. The Patient requires high complexity decision making for assessment and support.  Case was discussed on Rounds with the Respiratory Therapy Staff  Yevonne PaxSaadat A Meiya Wisler, MD [facility] Pulmonary Critical Care Medicine Sleep Medicine

## 2018-11-13 DIAGNOSIS — G40909 Epilepsy, unspecified, not intractable, without status epilepticus: Secondary | ICD-10-CM | POA: Diagnosis not present

## 2018-11-13 DIAGNOSIS — I63511 Cerebral infarction due to unspecified occlusion or stenosis of right middle cerebral artery: Secondary | ICD-10-CM | POA: Diagnosis not present

## 2018-11-13 DIAGNOSIS — J9621 Acute and chronic respiratory failure with hypoxia: Secondary | ICD-10-CM | POA: Diagnosis not present

## 2018-11-13 DIAGNOSIS — J449 Chronic obstructive pulmonary disease, unspecified: Secondary | ICD-10-CM | POA: Diagnosis not present

## 2018-11-13 LAB — BASIC METABOLIC PANEL
Anion gap: 12 (ref 5–15)
BUN: 22 mg/dL — ABNORMAL HIGH (ref 6–20)
CO2: 27 mmol/L (ref 22–32)
Calcium: 9.1 mg/dL (ref 8.9–10.3)
Chloride: 103 mmol/L (ref 98–111)
Creatinine, Ser: 0.7 mg/dL (ref 0.44–1.00)
GFR calc Af Amer: >60 mL/min (ref >60)
GFR calc non Af Amer: >60 mL/min (ref >60)
Glucose, Bld: 101 mg/dL — ABNORMAL HIGH (ref 70–99)
Potassium: 5.1 mmol/L (ref 3.5–5.1)
Sodium: 142 mmol/L (ref 135–145)

## 2018-11-13 NOTE — Progress Notes (Signed)
[facility]   PULMONARY CRITICAL CARE SERVICE  PROGRESS NOTE  Date of Service: 11/13/2018  Vanessa HamperChrystal West  ZOX:[id number]  DOB: [birth year]   DOA: 10/26/2018  Referring Physician: Carron CurieAli Hijazi, MD  HPI: Vanessa West is a 42 y.o. female seen for follow up of Acute on Chronic Respiratory Failure.  Off the ventilator right now on T collar currently 28% FiO2  Medications: Reviewed on Rounds  Physical Exam:  Vitals: Temperature 97.2 pulse 61 respiratory rate 14 blood pressure 135/91 saturations 98%  Ventilator Settings off ventilator on T collar  . General: Comfortable at this time . Eyes: Grossly normal lids, irises & conjunctiva . ENT: grossly tongue is normal . Neck: no obvious mass . Cardiovascular: S1 S2 normal no gallop . Respiratory: No rhonchi or rales are noted at this time . Abdomen: soft . Skin: no rash seen on limited exam . Musculoskeletal: not rigid . Psychiatric:unable to assess . Neurologic: no seizure no involuntary movements         Lab Data:   Basic Metabolic Panel: Recent Labs  Lab 11/09/18 0613 11/13/18 0519  NA 147* 142  K 3.2* 5.1  CL 108 103  CO2 27 27  GLUCOSE 152* 101*  BUN 18 22*  CREATININE 0.93 0.70  CALCIUM 8.9 9.1    ABG: No results for input(s): PHART, PCO2ART, PO2ART, HCO3, O2SAT in the last 168 hours.  Liver Function Tests: No results for input(s): AST, ALT, ALKPHOS, BILITOT, PROT, ALBUMIN in the last 168 hours. No results for input(s): LIPASE, AMYLASE in the last 168 hours. No results for input(s): AMMONIA in the last 168 hours.  CBC: Recent Labs  Lab 11/09/18 0613  WBC 9.2  HGB 10.3*  HCT 33.4*  MCV 75.9*  PLT 287    Cardiac Enzymes: No results for input(s): CKTOTAL, CKMB, CKMBINDEX, TROPONINI in the last 168 hours.  BNP (last 3 results) No results for input(s): BNP in the last 8760 hours.  ProBNP (last 3 results) No results for input(s): PROBNP in the  last 8760 hours.  Radiological Exams: No results found.  Assessment/Plan Active Problems:   Acute on chronic respiratory failure with hypoxia (HCC)   Acute right arterial ischemic stroke, middle cerebral artery (MCA) (HCC)   Seizure disorder (HCC)   Chronic obstructive asthma without status asthmaticus (HCC)   Tracheostomy status (HCC)   1. Acute on chronic respiratory failure with hypoxia doing very well with weaning.  We will continue to advance hopefully can begin capping soon 2. Ischemic stroke continue with supportive care 3. Seizure disorder no active seizures 4. 6 chronic obstructive asthma at baseline 5. Tracheostomy working towards decannulation   I have personally seen and evaluated the patient, evaluated laboratory and imaging results, formulated the assessment and plan and placed orders. The Patient requires high complexity decision making for assessment and support.  Case was discussed on Rounds with the Respiratory Therapy Staff  Yevonne PaxSaadat A Khan, MD [facility] Pulmonary Critical Care Medicine Sleep Medicine

## 2018-11-14 DIAGNOSIS — J9621 Acute and chronic respiratory failure with hypoxia: Secondary | ICD-10-CM | POA: Diagnosis not present

## 2018-11-14 DIAGNOSIS — I63511 Cerebral infarction due to unspecified occlusion or stenosis of right middle cerebral artery: Secondary | ICD-10-CM | POA: Diagnosis not present

## 2018-11-14 DIAGNOSIS — G40909 Epilepsy, unspecified, not intractable, without status epilepticus: Secondary | ICD-10-CM | POA: Diagnosis not present

## 2018-11-14 DIAGNOSIS — J449 Chronic obstructive pulmonary disease, unspecified: Secondary | ICD-10-CM | POA: Diagnosis not present

## 2018-11-14 NOTE — Progress Notes (Signed)
[facility]   PULMONARY CRITICAL CARE SERVICE  PROGRESS NOTE  Date of Service: 11/14/2018  Vanessa HamperChrystal Portales  YNW:[id number]  DOB: [birth year]   DOA: 10/26/2018  Referring Physician: Carron CurieAli Hijazi, MD  HPI: Vanessa HamperChrystal Bruemmer is a 42 y.o. female seen for follow up of Acute on Chronic Respiratory Failure.  Comfortable without distress she is on T collar has been on PMV tolerating well.  Patient is afebrile  Medications: Reviewed on Rounds  Physical Exam:  Vitals: Temperature 96.9 pulse 69 respiratory rate 17 blood pressure 169/93 saturations 100%  Ventilator Settings currently is off the ventilator on T collar with the PMV in place  . General: Comfortable at this time . Eyes: Grossly normal lids, irises & conjunctiva . ENT: grossly tongue is normal . Neck: no obvious mass . Cardiovascular: S1 S2 normal no gallop . Respiratory: No rhonchi or rales are noted at this time . Abdomen: soft . Skin: no rash seen on limited exam . Musculoskeletal: not rigid . Psychiatric:unable to assess . Neurologic: no seizure no involuntary movements         Lab Data:   Basic Metabolic Panel: Recent Labs  Lab 11/09/18 0613 11/13/18 0519  NA 147* 142  K 3.2* 5.1  CL 108 103  CO2 27 27  GLUCOSE 152* 101*  BUN 18 22*  CREATININE 0.93 0.70  CALCIUM 8.9 9.1    ABG: No results for input(s): PHART, PCO2ART, PO2ART, HCO3, O2SAT in the last 168 hours.  Liver Function Tests: No results for input(s): AST, ALT, ALKPHOS, BILITOT, PROT, ALBUMIN in the last 168 hours. No results for input(s): LIPASE, AMYLASE in the last 168 hours. No results for input(s): AMMONIA in the last 168 hours.  CBC: Recent Labs  Lab 11/09/18 0613  WBC 9.2  HGB 10.3*  HCT 33.4*  MCV 75.9*  PLT 287    Cardiac Enzymes: No results for input(s): CKTOTAL, CKMB, CKMBINDEX, TROPONINI in the last 168 hours.  BNP (last 3 results) No results for input(s): BNP in the last  8760 hours.  ProBNP (last 3 results) No results for input(s): PROBNP in the last 8760 hours.  Radiological Exams: No results found.  Assessment/Plan Active Problems:   Acute on chronic respiratory failure with hypoxia (HCC)   Acute right arterial ischemic stroke, middle cerebral artery (MCA) (HCC)   Seizure disorder (HCC)   Chronic obstructive asthma without status asthmaticus (HCC)   Tracheostomy status (HCC)   1. Acute on chronic respiratory failure with hypoxia we will continue with the T collar trials hopefully we should be able to advance to capping soon 2. Right-sided acute ischemic stroke at baseline continue with present management 3. Seizure disorder no active seizures noted 4. Chronic asthma at baseline 5. Tracheostomy remains in place we will continue with supportive care   I have personally seen and evaluated the patient, evaluated laboratory and imaging results, formulated the assessment and plan and placed orders. The Patient requires high complexity decision making for assessment and support.  Case was discussed on Rounds with the Respiratory Therapy Staff  Yevonne PaxSaadat A Khan, MD [facility] Pulmonary Critical Care Medicine Sleep Medicine

## 2018-11-15 DIAGNOSIS — J449 Chronic obstructive pulmonary disease, unspecified: Secondary | ICD-10-CM | POA: Diagnosis not present

## 2018-11-15 DIAGNOSIS — I63511 Cerebral infarction due to unspecified occlusion or stenosis of right middle cerebral artery: Secondary | ICD-10-CM | POA: Diagnosis not present

## 2018-11-15 DIAGNOSIS — J9621 Acute and chronic respiratory failure with hypoxia: Secondary | ICD-10-CM | POA: Diagnosis not present

## 2018-11-15 DIAGNOSIS — G40909 Epilepsy, unspecified, not intractable, without status epilepticus: Secondary | ICD-10-CM | POA: Diagnosis not present

## 2018-11-15 NOTE — Progress Notes (Signed)
Pulmonary Critical [facility]   PULMONARY CRITICAL CARE SERVICE  PROGRESS NOTE  Date of Service: 11/15/2018  Vanessa West  ZOX:[id number]  DOB: March 09, 1976   DOA: 10/26/2018  Referring Physician: Carron CurieAli Hijazi, MD  HPI: Vanessa HamperChrystal Siers is a 42 y.o. female seen for follow up of Acute on Chronic Respiratory Failure.  She is doing well without distress right now is on T collar has been on 28% FiO2  Medications: Reviewed on Rounds  Physical Exam:  Vitals: Temperature 97.2 pulse 82 respiratory 12 blood pressure 128/62 saturations 100%  Ventilator Settings off the ventilator on T collar  . General: Comfortable at this time . Eyes: Grossly normal lids, irises & conjunctiva . ENT: grossly tongue is normal . Neck: no obvious mass . Cardiovascular: S1 S2 normal no gallop . Respiratory: Coarse breath sounds with few rhonchi no rales are noted . Abdomen: soft . Skin: no rash seen on limited exam . Musculoskeletal: not rigid . Psychiatric:unable to assess . Neurologic: no seizure no involuntary movements         Lab Data:   Basic Metabolic Panel: Recent Labs  Lab 11/09/18 0613 11/13/18 0519  NA 147* 142  K 3.2* 5.1  CL 108 103  CO2 27 27  GLUCOSE 152* 101*  BUN 18 22*  CREATININE 0.93 0.70  CALCIUM 8.9 9.1    ABG: No results for input(s): PHART, PCO2ART, PO2ART, HCO3, O2SAT in the last 168 hours.  Liver Function Tests: No results for input(s): AST, ALT, ALKPHOS, BILITOT, PROT, ALBUMIN in the last 168 hours. No results for input(s): LIPASE, AMYLASE in the last 168 hours. No results for input(s): AMMONIA in the last 168 hours.  CBC: Recent Labs  Lab 11/09/18 0613  WBC 9.2  HGB 10.3*  HCT 33.4*  MCV 75.9*  PLT 287    Cardiac Enzymes: No results for input(s): CKTOTAL, CKMB, CKMBINDEX, TROPONINI in the last 168 hours.  BNP (last 3 results) No results for input(s): BNP in the last 8760 hours.  ProBNP (last 3 results) No  results for input(s): PROBNP in the last 8760 hours.  Radiological Exams: No results found.  Assessment/Plan Active Problems:   Acute on chronic respiratory failure with hypoxia (HCC)   Acute right arterial ischemic stroke, middle cerebral artery (MCA) (HCC)   Seizure disorder (HCC)   Chronic obstructive asthma without status asthmaticus (HCC)   Tracheostomy status (HCC)   1. Acute on chronic respiratory failure with hypoxia tolerating PMV on T collar will continue to advance. 2. Acute right-sided ischemic stroke improved we will continue to monitor 3. Seizure disorder currently is off the ventilator 4. Chronic asthma stable we will continue to monitor 5. Tracheostomy working towards capping and decannulation   I have personally seen and evaluated the patient, evaluated laboratory and imaging results, formulated the assessment and plan and placed orders. The Patient requires high complexity decision making for assessment and support.  Case was discussed on Rounds with the Respiratory Therapy Staff  Yevonne PaxSaadat A Khan, MD [facility] Pulmonary Critical Care Medicine Sleep Medicine

## 2018-11-16 DIAGNOSIS — G40909 Epilepsy, unspecified, not intractable, without status epilepticus: Secondary | ICD-10-CM | POA: Diagnosis not present

## 2018-11-16 DIAGNOSIS — J449 Chronic obstructive pulmonary disease, unspecified: Secondary | ICD-10-CM | POA: Diagnosis not present

## 2018-11-16 DIAGNOSIS — J9621 Acute and chronic respiratory failure with hypoxia: Secondary | ICD-10-CM | POA: Diagnosis not present

## 2018-11-16 DIAGNOSIS — I63511 Cerebral infarction due to unspecified occlusion or stenosis of right middle cerebral artery: Secondary | ICD-10-CM | POA: Diagnosis not present

## 2018-11-16 NOTE — Progress Notes (Signed)
[facility]   PULMONARY CRITICAL CARE SERVICE  PROGRESS NOTE  Date of Service: 11/16/2018  Vanessa West  ZOX:[id number]  DOB: Apr 17, 1976   DOA: 10/26/2018  Referring Physician: Carron CurieAli Hijazi, MD  HPI: Vanessa West is a 42 y.o. female seen for follow up of Acute on Chronic Respiratory Failure.  Patient is on T collar she is comfortable using the PMV no distress is noted  Medications: Reviewed on Rounds  Physical Exam:  Vitals: Temperature 96.4 pulse 60 respiratory 12 blood pressure 138/85 saturations 98%  Ventilator Settings currently on T collar with PMV  . General: Comfortable at this time . Eyes: Grossly normal lids, irises & conjunctiva . ENT: grossly tongue is normal . Neck: no obvious mass . Cardiovascular: S1 S2 normal no gallop . Respiratory: No rhonchi or rales are noted at this time . Abdomen: soft . Skin: no rash seen on limited exam . Musculoskeletal: not rigid . Psychiatric:unable to assess . Neurologic: no seizure no involuntary movements         Lab Data:   Basic Metabolic Panel: Recent Labs  Lab 11/13/18 0519  NA 142  K 5.1  CL 103  CO2 27  GLUCOSE 101*  BUN 22*  CREATININE 0.70  CALCIUM 9.1    ABG: No results for input(s): PHART, PCO2ART, PO2ART, HCO3, O2SAT in the last 168 hours.  Liver Function Tests: No results for input(s): AST, ALT, ALKPHOS, BILITOT, PROT, ALBUMIN in the last 168 hours. No results for input(s): LIPASE, AMYLASE in the last 168 hours. No results for input(s): AMMONIA in the last 168 hours.  CBC: No results for input(s): WBC, NEUTROABS, HGB, HCT, MCV, PLT in the last 168 hours.  Cardiac Enzymes: No results for input(s): CKTOTAL, CKMB, CKMBINDEX, TROPONINI in the last 168 hours.  BNP (last 3 results) No results for input(s): BNP in the last 8760 hours.  ProBNP (last 3 results) No results for input(s): PROBNP in the last 8760 hours.  Radiological Exams: No  results found.  Assessment/Plan Active Problems:   Acute on chronic respiratory failure with hypoxia (HCC)   Acute right arterial ischemic stroke, middle cerebral artery (MCA) (HCC)   Seizure disorder (HCC)   Chronic obstructive asthma without status asthmaticus (HCC)   Tracheostomy status (HCC)   1. Acute on chronic respiratory failure hypoxia continue with T collar continue secretion management pulmonary toilet 2. Right-sided ischemic stroke at baseline she needs to continue with physical therapy 3. Seizure disorder no active seizures 4. Asthma stable 5. Tracheostomy working towards removal   I have personally seen and evaluated the patient, evaluated laboratory and imaging results, formulated the assessment and plan and placed orders. The Patient requires high complexity decision making for assessment and support.  Case was discussed on Rounds with the Respiratory Therapy Staff  Yevonne PaxSaadat A Khan, MD [facility] Pulmonary Critical Care Medicine Sleep Medicine

## 2018-11-17 DIAGNOSIS — J9621 Acute and chronic respiratory failure with hypoxia: Secondary | ICD-10-CM | POA: Diagnosis not present

## 2018-11-17 DIAGNOSIS — G40909 Epilepsy, unspecified, not intractable, without status epilepticus: Secondary | ICD-10-CM | POA: Diagnosis not present

## 2018-11-17 DIAGNOSIS — J449 Chronic obstructive pulmonary disease, unspecified: Secondary | ICD-10-CM | POA: Diagnosis not present

## 2018-11-17 DIAGNOSIS — I63511 Cerebral infarction due to unspecified occlusion or stenosis of right middle cerebral artery: Secondary | ICD-10-CM | POA: Diagnosis not present

## 2018-11-17 NOTE — Progress Notes (Signed)
[facility]   PULMONARY CRITICAL CARE SERVICE  PROGRESS NOTE  Date of Service: 11/17/2018  Blaine HamperChrystal Boehme  UJW:[id number]:[id number]  DOB: 27-Feb-1976   DOA: 10/26/2018  Referring Physician: Carron CurieAli Hijazi, MD  HPI: Blaine HamperChrystal Oddo is a 42 y.o. female seen for follow up of Acute on Chronic Respiratory Failure.  Patient is on T collar currently with the PMV in place.  No distress is noted at this time.  Medications: Reviewed on Rounds  Physical Exam:  Vitals: Temperature 97.4 pulse 74 respiratory 15 blood pressure 136/90 saturations 98%  Ventilator Settings currently off the ventilator on T collar trials  . General: Comfortable at this time . Eyes: Grossly normal lids, irises & conjunctiva . ENT: grossly tongue is normal . Neck: no obvious mass . Cardiovascular: S1 S2 normal no gallop . Respiratory: No rhonchi no rales are noted at this time. . Abdomen: soft . Skin: no rash seen on limited exam . Musculoskeletal: not rigid . Psychiatric:unable to assess . Neurologic: no seizure no involuntary movements         Lab Data:   Basic Metabolic Panel: Recent Labs  Lab 11/13/18 0519  NA 142  K 5.1  CL 103  CO2 27  GLUCOSE 101*  BUN 22*  CREATININE 0.70  CALCIUM 9.1    ABG: No results for input(s): PHART, PCO2ART, PO2ART, HCO3, O2SAT in the last 168 hours.  Liver Function Tests: No results for input(s): AST, ALT, ALKPHOS, BILITOT, PROT, ALBUMIN in the last 168 hours. No results for input(s): LIPASE, AMYLASE in the last 168 hours. No results for input(s): AMMONIA in the last 168 hours.  CBC: No results for input(s): WBC, NEUTROABS, HGB, HCT, MCV, PLT in the last 168 hours.  Cardiac Enzymes: No results for input(s): CKTOTAL, CKMB, CKMBINDEX, TROPONINI in the last 168 hours.  BNP (last 3 results) No results for input(s): BNP in the last 8760 hours.  ProBNP (last 3 results) No results for input(s): PROBNP in the last 8760  hours.  Radiological Exams: No results found.  Assessment/Plan Active Problems:   Acute on chronic respiratory failure with hypoxia (HCC)   Acute right arterial ischemic stroke, middle cerebral artery (MCA) (HCC)   Seizure disorder (HCC)   Chronic obstructive asthma without status asthmaticus (HCC)   Tracheostomy status (HCC)   1. Acute on chronic respiratory failure with hypoxia we will continue with T collar trials continue aggressive pulmonary toilet secretion management.  I am going to try to advance her to capping today if able to tolerate 2. Acute ischemic stroke improving slowly continue with therapy as tolerated 3. Seizure disorders no active seizures 4. Chronic asthma stable 5. Tracheostomy working towards decannulation   I have personally seen and evaluated the patient, evaluated laboratory and imaging results, formulated the assessment and plan and placed orders. The Patient requires high complexity decision making for assessment and support.  Case was discussed on Rounds with the Respiratory Therapy Staff  Yevonne PaxSaadat A Susan Arana, MD [facility] Pulmonary Critical Care Medicine Sleep Medicine

## 2018-11-18 ENCOUNTER — Other Ambulatory Visit (HOSPITAL_COMMUNITY): Payer: Medicare Other

## 2018-11-18 DIAGNOSIS — G40909 Epilepsy, unspecified, not intractable, without status epilepticus: Secondary | ICD-10-CM | POA: Diagnosis not present

## 2018-11-18 DIAGNOSIS — J9621 Acute and chronic respiratory failure with hypoxia: Secondary | ICD-10-CM | POA: Diagnosis not present

## 2018-11-18 DIAGNOSIS — I63511 Cerebral infarction due to unspecified occlusion or stenosis of right middle cerebral artery: Secondary | ICD-10-CM | POA: Diagnosis not present

## 2018-11-18 DIAGNOSIS — J449 Chronic obstructive pulmonary disease, unspecified: Secondary | ICD-10-CM | POA: Diagnosis not present

## 2018-11-18 LAB — BASIC METABOLIC PANEL
Anion gap: 13 (ref 5–15)
BUN: 25 mg/dL — ABNORMAL HIGH (ref 6–20)
CALCIUM: 8.8 mg/dL — AB (ref 8.9–10.3)
CO2: 27 mmol/L (ref 22–32)
Chloride: 102 mmol/L (ref 98–111)
Creatinine, Ser: 0.86 mg/dL (ref 0.44–1.00)
GFR calc Af Amer: >60 mL/min (ref >60)
GFR calc non Af Amer: >60 mL/min (ref >60)
Glucose, Bld: 125 mg/dL — ABNORMAL HIGH (ref 70–99)
Potassium: 4.4 mmol/L (ref 3.5–5.1)
Sodium: 142 mmol/L (ref 135–145)

## 2018-11-18 LAB — CBC
HCT: 36.8 % (ref 36.0–46.0)
Hemoglobin: 11.4 g/dL — ABNORMAL LOW (ref 12.0–15.0)
MCH: 23.3 pg — ABNORMAL LOW (ref 26.0–34.0)
MCHC: 31 g/dL (ref 30.0–36.0)
MCV: 75.3 fL — ABNORMAL LOW (ref 80.0–100.0)
Platelets: 273 10*3/uL (ref 150–400)
RBC: 4.89 MIL/uL (ref 3.87–5.11)
RDW: 17.4 % — ABNORMAL HIGH (ref 11.5–15.5)
WBC: 12.5 10*3/uL — ABNORMAL HIGH (ref 4.0–10.5)
nRBC: 0.2 % (ref 0.0–0.2)

## 2018-11-18 NOTE — Progress Notes (Signed)
Pulmonary Critical Care Medicine [facility]   PULMONARY CRITICAL CARE SERVICE  PROGRESS NOTE  Date of Service: 11/18/2018  Vanessa HamperChrystal Pagnotta  BJY:[id number]:[id number]  DOB: [birth year]   DOA: 10/26/2018  Referring Physician: Carron CurieAli Hijazi, MD  HPI: Vanessa West is a 42 y.o. female seen for follow up of Acute on Chronic Respiratory Failure.  She is doing well capping on room air right now without any distress she is comfortable  Medications: Reviewed on Rounds  Physical Exam:  Vitals: Temperature 98.8 pulse 76 respiratory rate 20 blood pressure 127/82 saturations 94%  Ventilator Settings capping on room air  . General: Comfortable at this time . Eyes: Grossly normal lids, irises & conjunctiva . ENT: grossly tongue is normal . Neck: no obvious mass . Cardiovascular: S1 S2 normal no gallop . Respiratory: No rhonchi no rales . Abdomen: soft . Skin: no rash seen on limited exam . Musculoskeletal: not rigid . Psychiatric:unable to assess . Neurologic: no seizure no involuntary movements         Lab Data:   Basic Metabolic Panel: Recent Labs  Lab 11/13/18 0519 11/18/18 0632  NA 142 142  K 5.1 4.4  CL 103 102  CO2 27 27  GLUCOSE 101* 125*  BUN 22* 25*  CREATININE 0.70 0.86  CALCIUM 9.1 8.8*    ABG: No results for input(s): PHART, PCO2ART, PO2ART, HCO3, O2SAT in the last 168 hours.  Liver Function Tests: No results for input(s): AST, ALT, ALKPHOS, BILITOT, PROT, ALBUMIN in the last 168 hours. No results for input(s): LIPASE, AMYLASE in the last 168 hours. No results for input(s): AMMONIA in the last 168 hours.  CBC: Recent Labs  Lab 11/18/18 0632  WBC 12.5*  HGB 11.4*  HCT 36.8  MCV 75.3*  PLT 273    Cardiac Enzymes: No results for input(s): CKTOTAL, CKMB, CKMBINDEX, TROPONINI in the last 168 hours.  BNP (last 3 results) No results for input(s): BNP in the last 8760 hours.  ProBNP (last 3 results) No results for input(s): PROBNP in the  last 8760 hours.  Radiological Exams: No results found.  Assessment/Plan Active Problems:   Acute on chronic respiratory failure with hypoxia (HCC)   Acute right arterial ischemic stroke, middle cerebral artery (MCA) (HCC)   Seizure disorder (HCC)   Chronic obstructive asthma without status asthmaticus (HCC)   Tracheostomy status (HCC)   1. Acute on chronic respiratory failure with hypoxia we will continue with capping trials continue secretion management pulmonary toilet.  Chronically doing well 2. Tracheostomy working towards decannulation. 3. Acute stroke continue with therapy as tolerated 4. Asthma stable 5. Seizure disorder no active seizures are noted at this time.   I have personally seen and evaluated the patient, evaluated laboratory and imaging results, formulated the assessment and plan and placed orders. The Patient requires high complexity decision making for assessment and support.  Case was discussed on Rounds with the Respiratory Therapy Staff  Yevonne PaxSaadat A Khan, MD [facility] Pulmonary Critical Care Medicine Sleep Medicine

## 2018-11-19 DIAGNOSIS — J449 Chronic obstructive pulmonary disease, unspecified: Secondary | ICD-10-CM | POA: Diagnosis not present

## 2018-11-19 DIAGNOSIS — G40909 Epilepsy, unspecified, not intractable, without status epilepticus: Secondary | ICD-10-CM | POA: Diagnosis not present

## 2018-11-19 DIAGNOSIS — I63511 Cerebral infarction due to unspecified occlusion or stenosis of right middle cerebral artery: Secondary | ICD-10-CM | POA: Diagnosis not present

## 2018-11-19 DIAGNOSIS — J9621 Acute and chronic respiratory failure with hypoxia: Secondary | ICD-10-CM | POA: Diagnosis not present

## 2018-11-19 NOTE — Progress Notes (Signed)
[facility]   PULMONARY CRITICAL CARE SERVICE  PROGRESS NOTE  Date of Service: 11/19/2018  Vanessa West  ZOX:[id number]:[id number]  DOB: 11-07-1976   DOA: 10/26/2018  Referring Physician: Carron CurieAli Hijazi, MD  HPI: Vanessa West is a 42 y.o. female seen for follow up of Acute on Chronic Respiratory Failure.  Patient is capping doing very well.  She is on room air at this time.  Medications: Reviewed on Rounds  Physical Exam:  Vitals: Temperature 97.4 pulse 97 respiratory 20 blood pressure 148/92 saturations 97%  Ventilator Settings capping off the ventilator  . General: Comfortable at this time . Eyes: Grossly normal lids, irises & conjunctiva . ENT: grossly tongue is normal . Neck: no obvious mass . Cardiovascular: S1 S2 normal no gallop . Respiratory: No rhonchi or rales are noted at this time . Abdomen: soft . Skin: no rash seen on limited exam . Musculoskeletal: not rigid . Psychiatric:unable to assess . Neurologic: no seizure no involuntary movements         Lab Data:   Basic Metabolic Panel: Recent Labs  Lab 11/13/18 0519 11/18/18 0632  NA 142 142  K 5.1 4.4  CL 103 102  CO2 27 27  GLUCOSE 101* 125*  BUN 22* 25*  CREATININE 0.70 0.86  CALCIUM 9.1 8.8*    ABG: No results for input(s): PHART, PCO2ART, PO2ART, HCO3, O2SAT in the last 168 hours.  Liver Function Tests: No results for input(s): AST, ALT, ALKPHOS, BILITOT, PROT, ALBUMIN in the last 168 hours. No results for input(s): LIPASE, AMYLASE in the last 168 hours. No results for input(s): AMMONIA in the last 168 hours.  CBC: Recent Labs  Lab 11/18/18 0632  WBC 12.5*  HGB 11.4*  HCT 36.8  MCV 75.3*  PLT 273    Cardiac Enzymes: No results for input(s): CKTOTAL, CKMB, CKMBINDEX, TROPONINI in the last 168 hours.  BNP (last 3 results) No results for input(s): BNP in the last 8760 hours.  ProBNP (last 3 results) No results for input(s): PROBNP in  the last 8760 hours.  Radiological Exams: Ct Maxillofacial W Contrast  Result Date: 11/18/2018 CLINICAL DATA:  Conjunctivitis. EXAM: CT MAXILLOFACIAL WITH CONTRAST TECHNIQUE: Multidetector CT imaging of the maxillofacial structures was performed with intravenous contrast. Multiplanar CT image reconstructions were also generated. CONTRAST:  75mL ISOVUE-300 IOPAMIDOL (ISOVUE-300) INJECTION 61% The technologist reported an extravasation during the injection and minimal to no contrast is apparent on the submitted images. Dr. Quincy Carnesommy Lawrence evaluated the patient in the CT department before she was returned to her inpatient room. COMPARISON:  Head CT 05/13/2014. MRI orbits 03/10/2014. Maxillofacial CT 02/04/2010. FINDINGS: The study is mildly motion degraded. Osseous: No acute fracture, mandibular dislocation, or destructive osseous process. Orbits: Mild chronic asymmetric enlargement of the left lacrimal gland, much less prominent than on the 2011 CT and stable to slightly less prominent than on the interval MRI. Globes appear intact. No postseptal inflammation identified. Sinuses: Paranasal sinuses and mastoid air cells are clear. Soft tissues: Unremarkable. Limited intracranial: Partial visualization of extensive hypodensity/edema involving cortex and white matter of the right temporal, parietal, and frontal lobes with at most trace leftward midline shift. IMPRESSION: 1. No acute orbital or facial soft tissue abnormality identified. 2. Mild chronic asymmetric enlargement of the left lacrimal gland. 3. Partially visualized extensive edema involving the right cerebral hemisphere in this patient with a history of recent stroke. 4. Contrast extravasation. Electronically Signed   By: Jolaine ClickAllen  Grady M.D.  On: 11/18/2018 16:47    Assessment/Plan Active Problems:   Acute on chronic respiratory failure with hypoxia (HCC)   Acute right arterial ischemic stroke, middle cerebral artery (MCA) (HCC)   Seizure disorder  (HCC)   Chronic obstructive asthma without status asthmaticus (HCC)   Tracheostomy status (HCC)   1. Acute on chronic respiratory failure with hypoxia patient is going to be decannulated tomorrow 2. Acute ischemic stroke improving continue with supportive care 3. Seizure disorder no active seizures 4. Chronic asthma stable 5. Tracheostomy to be removed hopefully by tomorrow   I have personally seen and evaluated the patient, evaluated laboratory and imaging results, formulated the assessment and plan and placed orders. The Patient requires high complexity decision making for assessment and support.  Case was discussed on Rounds with the Respiratory Therapy Staff  Yevonne PaxSaadat A Khan, MD [facility] Pulmonary Critical Care Medicine Sleep Medicine

## 2018-11-20 DIAGNOSIS — J449 Chronic obstructive pulmonary disease, unspecified: Secondary | ICD-10-CM | POA: Diagnosis not present

## 2018-11-20 DIAGNOSIS — G40909 Epilepsy, unspecified, not intractable, without status epilepticus: Secondary | ICD-10-CM | POA: Diagnosis not present

## 2018-11-20 DIAGNOSIS — J9621 Acute and chronic respiratory failure with hypoxia: Secondary | ICD-10-CM | POA: Diagnosis not present

## 2018-11-20 DIAGNOSIS — I63511 Cerebral infarction due to unspecified occlusion or stenosis of right middle cerebral artery: Secondary | ICD-10-CM | POA: Diagnosis not present

## 2018-11-20 NOTE — Progress Notes (Signed)
[facility]   PULMONARY CRITICAL CARE SERVICE  PROGRESS NOTE  Date of Service: 11/20/2018  Vanessa West  ZOX:[id number]  DOB: [birth year]   DOA: 10/26/2018  Referring Physician: Carron CurieAli Hijazi, MD  HPI: Vanessa West is a 42 y.o. female seen for follow up of Acute on Chronic Respiratory Failure.  Doing well at this time patient has been tolerating capping we should be able to decannulate her today  Medications: Reviewed on Rounds  Physical Exam:  Vitals: Temperature 97.4 pulse 67 respiratory 16 blood pressure 146/80 saturations 100%  Ventilator Settings off the ventilator capping  . General: Comfortable at this time . Eyes: Grossly normal lids, irises & conjunctiva . ENT: grossly tongue is normal . Neck: no obvious mass . Cardiovascular: S1 S2 normal no gallop . Respiratory: No rhonchi no rales are noted . Abdomen: soft . Skin: no rash seen on limited exam . Musculoskeletal: not rigid . Psychiatric:unable to assess . Neurologic: no seizure no involuntary movements         Lab Data:   Basic Metabolic Panel: Recent Labs  Lab 11/18/18 0632  NA 142  K 4.4  CL 102  CO2 27  GLUCOSE 125*  BUN 25*  CREATININE 0.86  CALCIUM 8.8*    ABG: No results for input(s): PHART, PCO2ART, PO2ART, HCO3, O2SAT in the last 168 hours.  Liver Function Tests: No results for input(s): AST, ALT, ALKPHOS, BILITOT, PROT, ALBUMIN in the last 168 hours. No results for input(s): LIPASE, AMYLASE in the last 168 hours. No results for input(s): AMMONIA in the last 168 hours.  CBC: Recent Labs  Lab 11/18/18 0632  WBC 12.5*  HGB 11.4*  HCT 36.8  MCV 75.3*  PLT 273    Cardiac Enzymes: No results for input(s): CKTOTAL, CKMB, CKMBINDEX, TROPONINI in the last 168 hours.  BNP (last 3 results) No results for input(s): BNP in the last 8760 hours.  ProBNP (last 3 results) No results for input(s): PROBNP in the last 8760  hours.  Radiological Exams: Ct Maxillofacial W Contrast  Result Date: 11/18/2018 CLINICAL DATA:  Conjunctivitis. EXAM: CT MAXILLOFACIAL WITH CONTRAST TECHNIQUE: Multidetector CT imaging of the maxillofacial structures was performed with intravenous contrast. Multiplanar CT image reconstructions were also generated. CONTRAST:  75mL ISOVUE-300 IOPAMIDOL (ISOVUE-300) INJECTION 61% The technologist reported an extravasation during the injection and minimal to no contrast is apparent on the submitted images. Dr. Quincy Carnesommy Lawrence evaluated the patient in the CT department before she was returned to her inpatient room. COMPARISON:  Head CT 05/13/2014. MRI orbits 03/10/2014. Maxillofacial CT 02/04/2010. FINDINGS: The study is mildly motion degraded. Osseous: No acute fracture, mandibular dislocation, or destructive osseous process. Orbits: Mild chronic asymmetric enlargement of the left lacrimal gland, much less prominent than on the 2011 CT and stable to slightly less prominent than on the interval MRI. Globes appear intact. No postseptal inflammation identified. Sinuses: Paranasal sinuses and mastoid air cells are clear. Soft tissues: Unremarkable. Limited intracranial: Partial visualization of extensive hypodensity/edema involving cortex and white matter of the right temporal, parietal, and frontal lobes with at most trace leftward midline shift. IMPRESSION: 1. No acute orbital or facial soft tissue abnormality identified. 2. Mild chronic asymmetric enlargement of the left lacrimal gland. 3. Partially visualized extensive edema involving the right cerebral hemisphere in this patient with a history of recent stroke. 4. Contrast extravasation. Electronically Signed   By: Sebastian AcheAllen  Grady M.D.   On: 11/18/2018 16:47    Assessment/Plan Active Problems:  Acute on chronic respiratory failure with hypoxia (HCC)   Acute right arterial ischemic stroke, middle cerebral artery (MCA) (HCC)   Seizure disorder (HCC)   Chronic  obstructive asthma without status asthmaticus (HCC)   Tracheostomy status (HCC)   1. Acute on chronic respiratory failure with hypoxia we will continue with the weaning and I will proceed to decannulation. 2. Acute stroke improving we will continue to monitor 3. Seizure disorder no active seizures 4. Asthma at baseline continue to monitor 5. Tracheostomy for decannulation   I have personally seen and evaluated the patient, evaluated laboratory and imaging results, formulated the assessment and plan and placed orders. The Patient requires high complexity decision making for assessment and support.  Case was discussed on Rounds with the Respiratory Therapy Staff  Yevonne PaxSaadat A Khan, MD [facility] Pulmonary Critical Care Medicine Sleep Medicine

## 2018-11-21 DIAGNOSIS — J449 Chronic obstructive pulmonary disease, unspecified: Secondary | ICD-10-CM | POA: Diagnosis not present

## 2018-11-21 DIAGNOSIS — J9621 Acute and chronic respiratory failure with hypoxia: Secondary | ICD-10-CM | POA: Diagnosis not present

## 2018-11-21 DIAGNOSIS — I63511 Cerebral infarction due to unspecified occlusion or stenosis of right middle cerebral artery: Secondary | ICD-10-CM | POA: Diagnosis not present

## 2018-11-21 DIAGNOSIS — G40909 Epilepsy, unspecified, not intractable, without status epilepticus: Secondary | ICD-10-CM | POA: Diagnosis not present

## 2018-11-21 NOTE — Progress Notes (Addendum)
Pulmonary Critical Care Medicine [facility] [place]   PULMONARY CRITICAL CARE SERVICE  PROGRESS NOTE  Date of Service: 11/21/2018  Rikita Tippie  [id number]:[id number]  DOB: Aug 17, 1976   DOA: 10/26/2018  Referring Physician: Carron Curie, MD  HPI: Morelia Cluck is a 43 y.o. female seen for follow up of Acute on Chronic Respiratory Failure.  Patient is currently doing well she was decannulated yesterday.  She is on room air but having significant secretions still.  The secretions remain clear in appearance however she was suctioned overnight through her stoma by respiratory.  Medications: Reviewed on Rounds  Physical Exam:  Vitals: Pulse 78 respirations 16 blood pressure 151/80 796% O2 saturation temperature 97.3  Ventilator Settings patient is not currently on ventilator  . General: Comfortable at this time . Eyes: Grossly normal lids, irises & conjunctiva . ENT: grossly tongue is normal . Neck: no obvious mass . Cardiovascular: S1 S2 normal no gallop . Respiratory: No wheezes or rhonchi noted . Abdomen: soft . Skin: no rash seen on limited exam . Musculoskeletal: not rigid . Psychiatric:unable to assess . Neurologic: no seizure no involuntary movements         Lab Data:   Basic Metabolic Panel: Recent Labs  Lab 11/18/18 0632  NA 142  K 4.4  CL 102  CO2 27  GLUCOSE 125*  BUN 25*  CREATININE 0.86  CALCIUM 8.8*    ABG: No results for input(s): PHART, PCO2ART, PO2ART, HCO3, O2SAT in the last 168 hours.  Liver Function Tests: No results for input(s): AST, ALT, ALKPHOS, BILITOT, PROT, ALBUMIN in the last 168 hours. No results for input(s): LIPASE, AMYLASE in the last 168 hours. No results for input(s): AMMONIA in the last 168 hours.  CBC: Recent Labs  Lab 11/18/18 0632  WBC 12.5*  HGB 11.4*  HCT 36.8  MCV 75.3*  PLT 273    Cardiac Enzymes: No results for input(s): CKTOTAL, CKMB, CKMBINDEX, TROPONINI in the last 168 hours.  BNP (last 3  results) No results for input(s): BNP in the last 8760 hours.  ProBNP (last 3 results) No results for input(s): PROBNP in the last 8760 hours.  Radiological Exams: No results found.  Assessment/Plan Active Problems:   Acute on chronic respiratory failure with hypoxia (HCC)   Acute right arterial ischemic stroke, middle cerebral artery (MCA) (HCC)   Seizure disorder (HCC)   Chronic obstructive asthma without status asthmaticus (HCC)   Tracheostomy status (HCC)   1. Acute on chronic respiratory failure with hypoxia continue supportive measures.  Patient was decannulated yesterday.  Continue secretion management and pulmonary toilet. 2. Acute stroke improving we will continue to monitor 3. Seizure disorder no active seizures 4. Asthma at baseline continue to monitor 5. Tracheostomy decannulated on 11/20/2018.  Proceed with stoma care.   I have personally seen and evaluated the patient, evaluated laboratory and imaging results, formulated the assessment and plan and placed orders. The Patient requires high complexity decision making for assessment and support.  Case was discussed on Rounds with the Respiratory Therapy Staff  Yevonne Pax, MD [facility] Pulmonary Critical Care Medicine Sleep Medicine

## 2018-11-22 ENCOUNTER — Other Ambulatory Visit (HOSPITAL_COMMUNITY): Payer: Medicare Other

## 2018-11-22 DIAGNOSIS — I63511 Cerebral infarction due to unspecified occlusion or stenosis of right middle cerebral artery: Secondary | ICD-10-CM | POA: Diagnosis not present

## 2018-11-22 DIAGNOSIS — J449 Chronic obstructive pulmonary disease, unspecified: Secondary | ICD-10-CM | POA: Diagnosis not present

## 2018-11-22 DIAGNOSIS — G40909 Epilepsy, unspecified, not intractable, without status epilepticus: Secondary | ICD-10-CM | POA: Diagnosis not present

## 2018-11-22 DIAGNOSIS — J9621 Acute and chronic respiratory failure with hypoxia: Secondary | ICD-10-CM | POA: Diagnosis not present

## 2018-11-22 LAB — BASIC METABOLIC PANEL
ANION GAP: 11 (ref 5–15)
BUN: 20 mg/dL (ref 6–20)
CO2: 29 mmol/L (ref 22–32)
Calcium: 9.1 mg/dL (ref 8.9–10.3)
Chloride: 104 mmol/L (ref 98–111)
Creatinine, Ser: 0.72 mg/dL (ref 0.44–1.00)
GFR calc Af Amer: >60 mL/min (ref >60)
GFR calc non Af Amer: >60 mL/min (ref >60)
Glucose, Bld: 127 mg/dL — ABNORMAL HIGH (ref 70–99)
Potassium: 3.6 mmol/L (ref 3.5–5.1)
Sodium: 144 mmol/L (ref 135–145)

## 2018-11-22 LAB — URINALYSIS, ROUTINE W REFLEX MICROSCOPIC
Bilirubin Urine: NEGATIVE
GLUCOSE, UA: NEGATIVE mg/dL
Hgb urine dipstick: NEGATIVE
Ketones, ur: NEGATIVE mg/dL
Nitrite: NEGATIVE
PROTEIN: NEGATIVE mg/dL
Specific Gravity, Urine: 1.02 (ref 1.005–1.030)
pH: 5 (ref 5.0–8.0)

## 2018-11-22 LAB — TYPE AND SCREEN
ABO/RH(D): A POS
Antibody Screen: NEGATIVE

## 2018-11-22 LAB — CBC
HCT: 39.6 % (ref 36.0–46.0)
Hemoglobin: 11.9 g/dL — ABNORMAL LOW (ref 12.0–15.0)
MCH: 22.7 pg — ABNORMAL LOW (ref 26.0–34.0)
MCHC: 30.1 g/dL (ref 30.0–36.0)
MCV: 75.6 fL — ABNORMAL LOW (ref 80.0–100.0)
Platelets: 224 10*3/uL (ref 150–400)
RBC: 5.24 MIL/uL — ABNORMAL HIGH (ref 3.87–5.11)
RDW: 17.4 % — ABNORMAL HIGH (ref 11.5–15.5)
WBC: 9.6 10*3/uL (ref 4.0–10.5)
nRBC: 0 % (ref 0.0–0.2)

## 2018-11-22 LAB — ABO/RH: ABO/RH(D): A POS

## 2018-11-22 LAB — VALPROIC ACID LEVEL: Valproic Acid Lvl: 49 ug/mL — ABNORMAL LOW (ref 50.0–100.0)

## 2018-11-22 NOTE — Progress Notes (Signed)
Pulmonary Critical Care Medicine [facility] [place]   PULMONARY CRITICAL CARE SERVICE  PROGRESS NOTE  Date of Service: 11/22/2018  Vanessa West  [id number]  DOB: 10/21/1976   DOA: 10/26/2018  Referring Physician: Carron Curie, MD  HPI: Vanessa West is a 43 y.o. female seen for follow up of Acute on Chronic Respiratory Failure.  Patient is on T collar right now currently is on 28% FiO2 apparently had a seizure with some mental status changes had to have the tracheostomy placed back in because of desaturations.  Medications: Reviewed on Rounds  Physical Exam:  Vitals: Temperature 98.7 pulse 67 respiratory rate 21 blood pressure 154/48 saturations 98%  Ventilator Settings off the ventilator at this time  . General: Comfortable at this time . Eyes: Grossly normal lids, irises & conjunctiva . ENT: grossly tongue is normal . Neck: no obvious mass . Cardiovascular: S1 S2 normal no gallop . Respiratory: No rhonchi or rales are noted at this time . Abdomen: soft . Skin: no rash seen on limited exam . Musculoskeletal: not rigid . Psychiatric:unable to assess . Neurologic: no seizure no involuntary movements         Lab Data:   Basic Metabolic Panel: Recent Labs  Lab 11/18/18 0632 11/22/18 1027  NA 142 144  K 4.4 3.6  CL 102 104  CO2 27 29  GLUCOSE 125* 127*  BUN 25* 20  CREATININE 0.86 0.72  CALCIUM 8.8* 9.1    ABG: No results for input(s): PHART, PCO2ART, PO2ART, HCO3, O2SAT in the last 168 hours.  Liver Function Tests: No results for input(s): AST, ALT, ALKPHOS, BILITOT, PROT, ALBUMIN in the last 168 hours. No results for input(s): LIPASE, AMYLASE in the last 168 hours. No results for input(s): AMMONIA in the last 168 hours.  CBC: Recent Labs  Lab 11/18/18 0632 11/22/18 1027  WBC 12.5* 9.6  HGB 11.4* 11.9*  HCT 36.8 39.6  MCV 75.3* 75.6*  PLT 273 224    Cardiac Enzymes: No results for input(s): CKTOTAL, CKMB, CKMBINDEX, TROPONINI in  the last 168 hours.  BNP (last 3 results) No results for input(s): BNP in the last 8760 hours.  ProBNP (last 3 results) No results for input(s): PROBNP in the last 8760 hours.  Radiological Exams: No results found.  Assessment/Plan Active Problems:   Acute on chronic respiratory failure with hypoxia (HCC)   Acute right arterial ischemic stroke, middle cerebral artery (MCA) (HCC)   Seizure disorder (HCC)   Chronic obstructive asthma without status asthmaticus (HCC)   Tracheostomy status (HCC)   1. Acute on chronic respiratory failure with hypoxia the tracheostomy was placed back in because of the seizure and airway protection. 2. Acute stroke CT scan was reordered concern for worsening brain edema. 3. Seizure disorder had a follow-up seizure as already noted above will need follow-up CT scan. 4. Asthma stable 5. Tracheostomy had to be replaced after decannulation because of airway issues and seizure   I have personally seen and evaluated the patient, evaluated laboratory and imaging results, formulated the assessment and plan and placed orders. The Patient requires high complexity decision making for assessment and support.  Case was discussed on Rounds with the Respiratory Therapy Staff  Yevonne Pax, MD [facility] Pulmonary Critical Care Medicine Sleep Medicine

## 2018-11-23 ENCOUNTER — Other Ambulatory Visit (HOSPITAL_COMMUNITY): Payer: Medicare Other

## 2018-11-23 DIAGNOSIS — J449 Chronic obstructive pulmonary disease, unspecified: Secondary | ICD-10-CM | POA: Diagnosis not present

## 2018-11-23 DIAGNOSIS — G40909 Epilepsy, unspecified, not intractable, without status epilepticus: Secondary | ICD-10-CM | POA: Diagnosis not present

## 2018-11-23 DIAGNOSIS — J9621 Acute and chronic respiratory failure with hypoxia: Secondary | ICD-10-CM | POA: Diagnosis not present

## 2018-11-23 DIAGNOSIS — I63511 Cerebral infarction due to unspecified occlusion or stenosis of right middle cerebral artery: Secondary | ICD-10-CM | POA: Diagnosis not present

## 2018-11-23 LAB — BASIC METABOLIC PANEL
Anion gap: 10 (ref 5–15)
BUN: 19 mg/dL (ref 6–20)
CO2: 27 mmol/L (ref 22–32)
Calcium: 9.2 mg/dL (ref 8.9–10.3)
Chloride: 106 mmol/L (ref 98–111)
Creatinine, Ser: 0.65 mg/dL (ref 0.44–1.00)
GFR calc Af Amer: >60 mL/min (ref >60)
GFR calc non Af Amer: >60 mL/min (ref >60)
Glucose, Bld: 168 mg/dL — ABNORMAL HIGH (ref 70–99)
Potassium: 4.9 mmol/L (ref 3.5–5.1)
Sodium: 143 mmol/L (ref 135–145)

## 2018-11-23 LAB — URINE CULTURE: Culture: NO GROWTH

## 2018-11-23 LAB — TRIGLYCERIDES: Triglycerides: 45 mg/dL (ref <150)

## 2018-11-23 MED ORDER — GADOBUTROL 1 MMOL/ML IV SOLN
9.0000 mL | Freq: Once | INTRAVENOUS | Status: AC | PRN
  Administered 2018-11-23: 9 mL via INTRAVENOUS

## 2018-11-23 NOTE — Progress Notes (Signed)
[facility]   PULMONARY CRITICAL CARE SERVICE  PROGRESS NOTE  Date of Service: 11/23/2018  Vanessa HamperChrystal West  ZOX:[id number]  DOB: Aug 19, 1976   DOA: 10/26/2018  Referring Physician: Carron CurieAli Hijazi, MD  HPI: Vanessa West is a 43 y.o. female seen for follow up of Acute on Chronic Respiratory Failure.  Patient is on T collar this morning.  She had a CT scan done which shows significant cerebral edema with effacement and midline shift.  The patient has had seizures she is also had a decline in her mental status.  My concern would be if there is worsening intracranial pathology.  Spoke with respiratory therapy and we are going to place her back on the ventilator for now.  I also spoke with the primary care team and they are going to consider placing her on Decadron  Medications: Reviewed on Rounds  Physical Exam:  Vitals: Temperature 97.6 pulse 78 respiratory 18 blood pressure 122/71 saturations 99%  Ventilator Settings currently was off the ventilator will be placed back on the ventilator  . General: Comfortable at this time . Eyes: Grossly normal lids, irises & conjunctiva . ENT: grossly tongue is normal . Neck: no obvious mass . Cardiovascular: S1 S2 normal no gallop . Respiratory: Scattered rhonchi expansion is equal . Abdomen: soft . Skin: no rash seen on limited exam . Musculoskeletal: not rigid . Psychiatric:unable to assess . Neurologic: no seizure no involuntary movements         Lab Data:   Basic Metabolic Panel: Recent Labs  Lab 11/18/18 0632 11/22/18 1027  NA 142 144  K 4.4 3.6  CL 102 104  CO2 27 29  GLUCOSE 125* 127*  BUN 25* 20  CREATININE 0.86 0.72  CALCIUM 8.8* 9.1    ABG: No results for input(s): PHART, PCO2ART, PO2ART, HCO3, O2SAT in the last 168 hours.  Liver Function Tests: No results for input(s): AST, ALT, ALKPHOS, BILITOT, PROT, ALBUMIN in the last 168 hours. No results for input(s): LIPASE,  AMYLASE in the last 168 hours. No results for input(s): AMMONIA in the last 168 hours.  CBC: Recent Labs  Lab 11/18/18 0632 11/22/18 1027  WBC 12.5* 9.6  HGB 11.4* 11.9*  HCT 36.8 39.6  MCV 75.3* 75.6*  PLT 273 224    Cardiac Enzymes: No results for input(s): CKTOTAL, CKMB, CKMBINDEX, TROPONINI in the last 168 hours.  BNP (last 3 results) No results for input(s): BNP in the last 8760 hours.  ProBNP (last 3 results) No results for input(s): PROBNP in the last 8760 hours.  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 History of seizures. EXAM: CT HEAD WITHOUT CONTRAST TECHNIQUE: Contiguous axial images were obtained from the base of the skull through the vertex without intravenous contrast. COMPARISON:  05/13/2014 CT head. 03/10/2014 MRI head. FINDINGS: Brain: Extensive edema throughout the right cerebral hemisphere in all vascular territories with relative sparing of the basal ganglia. There is involvement of right posterior thalamus. Additionally, there is hypoattenuation with loss of gray-white differentiation left cerebellar hemisphere. Mass effect partially effaces the right lateral ventricle and results in 7 mm of right-to-left midline shift. No herniation or hemorrhage. No extra-axial collection. Vascular: No hyperdense vessel or unexpected calcification. Skull: Normal. Negative for fracture or focal lesion. Sinuses/Orbits: No acute finding. Other: None. IMPRESSION: 1. Severe edema throughout the right cerebral hemisphere, right posterior thalamus, and left cerebellum. Findings may represent severe  seizure activity with cross cerebellar diaschisis, multi territory infarction, or encephalitis. MRI of the head with and without contrast is recommended. 2. Mass effect partially effaces the right lateral ventricle and results in 7 mm of right-to-left midline shift. These results will be called to the ordering  clinician or representative by the Radiologist Assistant, and communication documented in the PACS or zVision Dashboard. Electronically Signed   By: Mitzi Hansen M.D.   On: 11/22/2018 22:07    Assessment/Plan Active Problems:   Acute on chronic respiratory failure with hypoxia (HCC)   Acute right arterial ischemic stroke, middle cerebral artery (MCA) (HCC)   Seizure disorder (HCC)   Chronic obstructive asthma without status asthmaticus (HCC)   Tracheostomy status (HCC)   1. Acute on chronic respiratory failure with hypoxia she is actually been doing fine with the T collar however because of the increased increased intracranial pressure it may be prudent to place her on the ventilator and to hyperventilate her. 2. Acute now chronic right ischemic stroke with the findings of the CT scan as noted above patient is going to receive hyperventilation patient will also receive steroids.  Since the patient is be on the acute stage as far as a stroke is concerned he should be safe for [place] to increase her ventilatory rate.  This should give this time while the steroids take time to work. 3. Seizure disorder she has not had any active seizures we will continue with supportive care.  However we do not know if she is in electrical status epilepticus.  Would recommend getting a neurology evaluation and consider transfer to neurosurgical facility. 4. Chronic obstructive asthma patient is actually doing fine is stable. 5. Tracheostomy will remain in place we will change it over to a cuffed trach for ventilation purposes   I have personally seen and evaluated the patient, evaluated laboratory and imaging results, formulated the assessment and plan and placed orders.  Time spent 35 minutes patient has had a significant change in her status The Patient requires high complexity decision making for assessment and support.  Case was discussed on Rounds with the Respiratory Therapy Staff  Yevonne Pax, MD  [facility] Pulmonary Critical Care Medicine Sleep Medicine

## 2018-11-24 LAB — LEVETIRACETAM LEVEL: Levetiracetam Lvl: 49.6 ug/mL — ABNORMAL HIGH (ref 10.0–40.0)

## 2018-11-24 LAB — CULTURE, RESPIRATORY W GRAM STAIN
Culture: NORMAL
Gram Stain: NONE SEEN

## 2018-11-24 MED ORDER — PURALUBE 85-15 % OP OINT
TOPICAL_OINTMENT | OPHTHALMIC | Status: DC
Start: 2018-11-24 — End: 2018-11-24

## 2018-11-24 MED ORDER — ASPIRIN 325 MG PO TABS
325.00 | ORAL_TABLET | ORAL | Status: DC
Start: 2018-11-24 — End: 2018-11-24

## 2018-11-24 MED ORDER — LAMOTRIGINE 100 MG PO TABS
100.00 | ORAL_TABLET | ORAL | Status: DC
Start: 2018-11-24 — End: 2018-11-24

## 2018-11-24 MED ORDER — GENERIC EXTERNAL MEDICATION
3.00 | Status: DC
Start: ? — End: 2018-11-24

## 2018-11-24 MED ORDER — LACOSAMIDE 200 MG PO TABS
200.00 | ORAL_TABLET | ORAL | Status: DC
Start: 2018-11-24 — End: 2018-11-24

## 2018-11-24 MED ORDER — LEVETIRACETAM IN NACL 1500 MG/100ML IV SOLN
1500.00 | INTRAVENOUS | Status: DC
Start: 2018-11-24 — End: 2018-11-24

## 2018-11-24 MED ORDER — FAMOTIDINE 20 MG PO TABS
20.00 | ORAL_TABLET | ORAL | Status: DC
Start: 2018-11-24 — End: 2018-11-24

## 2018-11-24 MED ORDER — MUPIROCIN 2 % EX OINT
TOPICAL_OINTMENT | CUTANEOUS | Status: DC
Start: 2018-11-24 — End: 2018-11-24

## 2018-11-24 MED ORDER — ATORVASTATIN CALCIUM 40 MG PO TABS
40.00 | ORAL_TABLET | ORAL | Status: DC
Start: 2018-11-24 — End: 2018-11-24

## 2018-11-24 MED ORDER — DORZOLAMIDE HCL-TIMOLOL MAL 22.3-6.8 MG/ML OP SOLN
1.00 | OPHTHALMIC | Status: DC
Start: 2018-11-24 — End: 2018-11-24

## 2018-11-24 MED ORDER — SODIUM CHLORIDE 0.9 % IV SOLN
10.00 | INTRAVENOUS | Status: DC
Start: ? — End: 2018-11-24

## 2018-11-24 MED ORDER — CHLORHEXIDINE GLUCONATE 0.12 % MT SOLN
15.00 | OROMUCOSAL | Status: DC
Start: 2018-11-24 — End: 2018-11-24

## 2018-11-24 MED ORDER — VALPROIC ACID 250 MG/5ML PO SOLN
750.00 | ORAL | Status: DC
Start: 2018-11-24 — End: 2018-11-24

## 2019-01-20 DEATH — deceased

## 2020-08-18 IMAGING — DX DG ABDOMEN 1V
1 series · 1 of 1 positions shown · non-contrast
Comparison: CT 04/25/2015

CLINICAL DATA: Peg tube status

EXAM:
ABDOMEN - 1 VIEW

[abdomen kub]
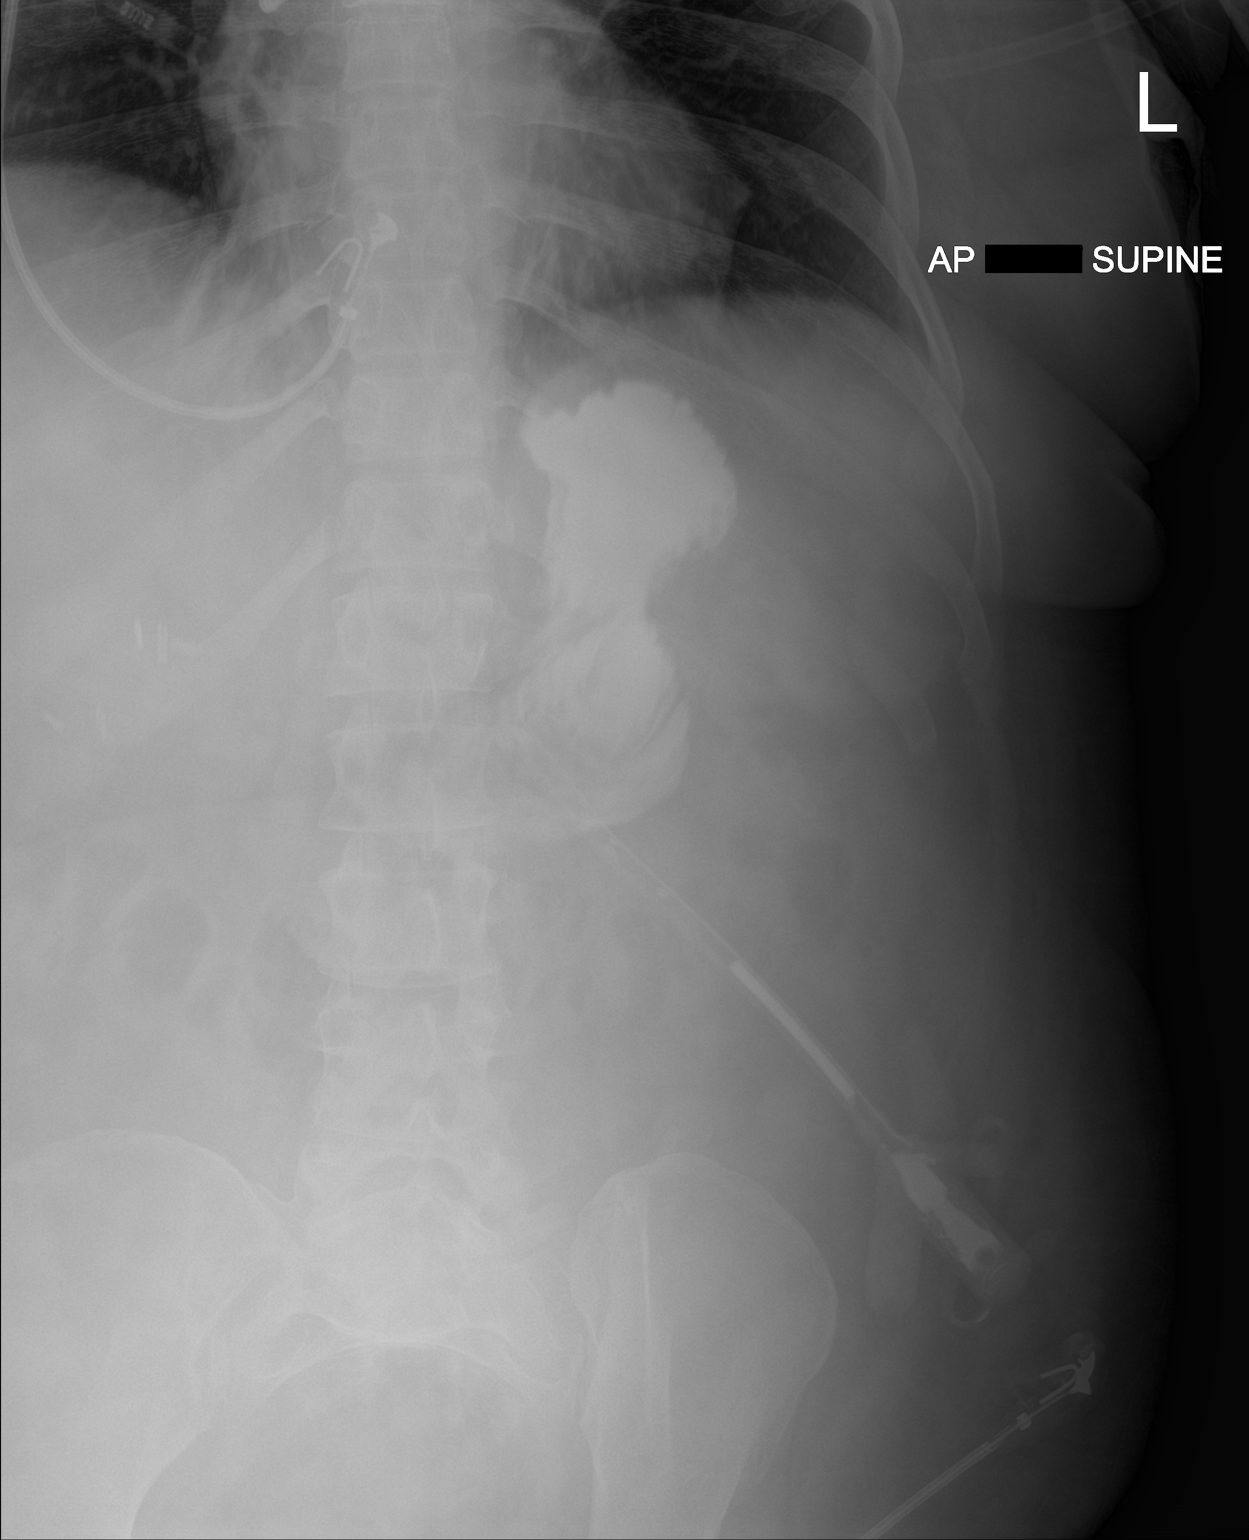

[1 of 1 positions shown; findings below may reference images not displayed]

FINDINGS: Hand injection of contrast through the percutaneous gastrostomy tube
demonstrates contrast filling the gastric fundus and gastric body.
Retention bulb appears to be towards the gastric antrum.
IMPRESSION: Contrast fills the stomach consistent with appropriate positioning
of the percutaneous gastrostomy tube.
